# Patient Record
Sex: Female | Born: 1940 | Race: White | Hispanic: No | Marital: Married | Smoking: Never smoker
Health system: Southern US, Community
[De-identification: ages and names within clinical notes are randomized; demographics above are authoritative.]

## PROBLEM LIST (undated history)

## (undated) DIAGNOSIS — I700 Atherosclerosis of aorta: Secondary | ICD-10-CM

## (undated) DIAGNOSIS — Z8619 Personal history of other infectious and parasitic diseases: Secondary | ICD-10-CM

## (undated) DIAGNOSIS — E7800 Pure hypercholesterolemia, unspecified: Secondary | ICD-10-CM

## (undated) DIAGNOSIS — R232 Flushing: Secondary | ICD-10-CM

## (undated) DIAGNOSIS — M109 Gout, unspecified: Secondary | ICD-10-CM

## (undated) DIAGNOSIS — K760 Fatty (change of) liver, not elsewhere classified: Secondary | ICD-10-CM

## (undated) DIAGNOSIS — R251 Tremor, unspecified: Secondary | ICD-10-CM

## (undated) DIAGNOSIS — I10 Essential (primary) hypertension: Secondary | ICD-10-CM

## (undated) DIAGNOSIS — M549 Dorsalgia, unspecified: Secondary | ICD-10-CM

## (undated) DIAGNOSIS — M8580 Other specified disorders of bone density and structure, unspecified site: Secondary | ICD-10-CM

## (undated) DIAGNOSIS — K649 Unspecified hemorrhoids: Secondary | ICD-10-CM

## (undated) DIAGNOSIS — E559 Vitamin D deficiency, unspecified: Secondary | ICD-10-CM

## (undated) HISTORY — DX: Flushing: R23.2

## (undated) HISTORY — DX: Other specified disorders of bone density and structure, unspecified site: M85.80

## (undated) HISTORY — DX: Vitamin D deficiency, unspecified: E55.9

## (undated) HISTORY — DX: Gout, unspecified: M10.9

## (undated) HISTORY — DX: Fatty (change of) liver, not elsewhere classified: K76.0

## (undated) HISTORY — DX: Hypercalcemia: E83.52

## (undated) HISTORY — DX: Pure hypercholesterolemia, unspecified: E78.00

## (undated) HISTORY — DX: Tremor, unspecified: R25.1

## (undated) HISTORY — DX: Unspecified hemorrhoids: K64.9

## (undated) HISTORY — DX: Personal history of other infectious and parasitic diseases: Z86.19

## (undated) HISTORY — DX: Atherosclerosis of aorta: I70.0

## (undated) HISTORY — DX: Dorsalgia, unspecified: M54.9

---

## 2000-12-25 ENCOUNTER — Other Ambulatory Visit: Admission: RE | Admit: 2000-12-25 | Discharge: 2000-12-25 | Payer: Self-pay | Admitting: Family Medicine

## 2003-12-01 ENCOUNTER — Ambulatory Visit (HOSPITAL_COMMUNITY): Admission: RE | Admit: 2003-12-01 | Discharge: 2003-12-01 | Payer: Self-pay | Admitting: Family Medicine

## 2003-12-01 ENCOUNTER — Other Ambulatory Visit: Admission: RE | Admit: 2003-12-01 | Discharge: 2003-12-01 | Payer: Self-pay | Admitting: Family Medicine

## 2004-04-10 ENCOUNTER — Ambulatory Visit (HOSPITAL_COMMUNITY): Admission: RE | Admit: 2004-04-10 | Discharge: 2004-04-10 | Payer: Self-pay | Admitting: Gastroenterology

## 2005-02-21 ENCOUNTER — Other Ambulatory Visit: Admission: RE | Admit: 2005-02-21 | Discharge: 2005-02-21 | Payer: Self-pay | Admitting: Family Medicine

## 2008-02-05 ENCOUNTER — Other Ambulatory Visit: Admission: RE | Admit: 2008-02-05 | Discharge: 2008-02-05 | Payer: Self-pay | Admitting: Family Medicine

## 2010-07-19 ENCOUNTER — Encounter: Admission: RE | Admit: 2010-07-19 | Discharge: 2010-07-19 | Payer: Self-pay | Admitting: Family Medicine

## 2010-08-02 ENCOUNTER — Emergency Department (HOSPITAL_BASED_OUTPATIENT_CLINIC_OR_DEPARTMENT_OTHER): Admission: EM | Admit: 2010-08-02 | Discharge: 2010-08-02 | Payer: Self-pay | Admitting: Emergency Medicine

## 2010-08-02 ENCOUNTER — Ambulatory Visit: Payer: Self-pay | Admitting: Diagnostic Radiology

## 2011-02-01 NOTE — Op Note (Signed)
NAME:  Lauren Key, Lauren Key                              ACCOUNT NO.:  [id number]   MEDICAL RECORD NO.:  [id number]                   PATIENT TYPE:  AMB   LOCATION:  ENDO                                 FACILITY:  [facility]   PHYSICIAN:  Graylin Shiver, M.D.                DATE OF BIRTH:  28-May-1941   DATE OF PROCEDURE:  04/10/2004  DATE OF DISCHARGE:                                 OPERATIVE REPORT   PROCEDURE:  Colonoscopy.   ENDOSCOPIST:  Graylin Shiver, M.D.   INDICATIONS:  Intermittent rectal bleeding.   INFORMED CONSENT:  Informed consent was obtained after explanation of the  risks of bleeding, infection and perforation.   PREMEDICATION:  Fentanyl 65 mcg IV, Versed 5 mg IV.   DESCRIPTION OF PROCEDURE:  With the patient in the left lateral decubitus  position, a rectal exam was performed and no masses were felt.  The Olympus  colonoscope was inserted into the rectum and advanced around the colon to  the cecum.  The cecal landmarks were identified.  The cecum and ascending  colon were normal.  The transverse colon was normal.  The descending colon,  sigmoid and rectum were normal.  The scope was retroflexed in the rectum.  No significant abnormalities were seen.  The scope was then straightened and  brought out.  She tolerated the procedure well without complications.   IMPRESSION:  Normal colonoscopy to the cecum.   I suspect that the intermittent rectal bleeding the patient is secondary to  some bleeding from the anal area irritation at times.  I see no specific  abnormality on examination.                                               Graylin Shiver, M.D.    Germain Osgood  D:  04/10/2004  T:  04/10/2004  Job:  [id number]   cc:   Meredith Staggers, M.D.  [address], [address]  [place]  [place] [address]  Fax: [phone]

## 2011-11-27 DIAGNOSIS — J40 Bronchitis, not specified as acute or chronic: Secondary | ICD-10-CM | POA: Diagnosis not present

## 2011-12-05 DIAGNOSIS — M899 Disorder of bone, unspecified: Secondary | ICD-10-CM | POA: Diagnosis not present

## 2011-12-05 DIAGNOSIS — E559 Vitamin D deficiency, unspecified: Secondary | ICD-10-CM | POA: Diagnosis not present

## 2011-12-05 DIAGNOSIS — I10 Essential (primary) hypertension: Secondary | ICD-10-CM | POA: Diagnosis not present

## 2011-12-05 DIAGNOSIS — E782 Mixed hyperlipidemia: Secondary | ICD-10-CM | POA: Diagnosis not present

## 2011-12-05 DIAGNOSIS — M949 Disorder of cartilage, unspecified: Secondary | ICD-10-CM | POA: Diagnosis not present

## 2011-12-05 DIAGNOSIS — R7309 Other abnormal glucose: Secondary | ICD-10-CM | POA: Diagnosis not present

## 2011-12-05 DIAGNOSIS — R0989 Other specified symptoms and signs involving the circulatory and respiratory systems: Secondary | ICD-10-CM | POA: Diagnosis not present

## 2012-06-04 DIAGNOSIS — R0989 Other specified symptoms and signs involving the circulatory and respiratory systems: Secondary | ICD-10-CM | POA: Diagnosis not present

## 2012-06-04 DIAGNOSIS — E782 Mixed hyperlipidemia: Secondary | ICD-10-CM | POA: Diagnosis not present

## 2012-06-04 DIAGNOSIS — I10 Essential (primary) hypertension: Secondary | ICD-10-CM | POA: Diagnosis not present

## 2012-06-04 DIAGNOSIS — R7309 Other abnormal glucose: Secondary | ICD-10-CM | POA: Diagnosis not present

## 2012-06-04 DIAGNOSIS — H8110 Benign paroxysmal vertigo, unspecified ear: Secondary | ICD-10-CM | POA: Diagnosis not present

## 2012-06-04 DIAGNOSIS — M949 Disorder of cartilage, unspecified: Secondary | ICD-10-CM | POA: Diagnosis not present

## 2012-06-04 DIAGNOSIS — E559 Vitamin D deficiency, unspecified: Secondary | ICD-10-CM | POA: Diagnosis not present

## 2012-06-04 DIAGNOSIS — M899 Disorder of bone, unspecified: Secondary | ICD-10-CM | POA: Diagnosis not present

## 2012-11-28 DIAGNOSIS — M2550 Pain in unspecified joint: Secondary | ICD-10-CM | POA: Diagnosis not present

## 2012-11-28 DIAGNOSIS — M25579 Pain in unspecified ankle and joints of unspecified foot: Secondary | ICD-10-CM | POA: Diagnosis not present

## 2012-12-05 DIAGNOSIS — J209 Acute bronchitis, unspecified: Secondary | ICD-10-CM | POA: Diagnosis not present

## 2013-03-23 DIAGNOSIS — M949 Disorder of cartilage, unspecified: Secondary | ICD-10-CM | POA: Diagnosis not present

## 2013-03-23 DIAGNOSIS — R7309 Other abnormal glucose: Secondary | ICD-10-CM | POA: Diagnosis not present

## 2013-03-23 DIAGNOSIS — M899 Disorder of bone, unspecified: Secondary | ICD-10-CM | POA: Diagnosis not present

## 2013-03-23 DIAGNOSIS — Z8701 Personal history of pneumonia (recurrent): Secondary | ICD-10-CM | POA: Diagnosis not present

## 2013-03-23 DIAGNOSIS — Z1331 Encounter for screening for depression: Secondary | ICD-10-CM | POA: Diagnosis not present

## 2013-03-23 DIAGNOSIS — I10 Essential (primary) hypertension: Secondary | ICD-10-CM | POA: Diagnosis not present

## 2013-03-23 DIAGNOSIS — E782 Mixed hyperlipidemia: Secondary | ICD-10-CM | POA: Diagnosis not present

## 2013-03-23 DIAGNOSIS — E559 Vitamin D deficiency, unspecified: Secondary | ICD-10-CM | POA: Diagnosis not present

## 2013-03-23 DIAGNOSIS — R0989 Other specified symptoms and signs involving the circulatory and respiratory systems: Secondary | ICD-10-CM | POA: Diagnosis not present

## 2013-03-23 DIAGNOSIS — M79609 Pain in unspecified limb: Secondary | ICD-10-CM | POA: Diagnosis not present

## 2013-07-21 DIAGNOSIS — R059 Cough, unspecified: Secondary | ICD-10-CM | POA: Diagnosis not present

## 2013-07-21 DIAGNOSIS — R05 Cough: Secondary | ICD-10-CM | POA: Diagnosis not present

## 2013-07-21 DIAGNOSIS — R509 Fever, unspecified: Secondary | ICD-10-CM | POA: Diagnosis not present

## 2013-07-28 DIAGNOSIS — H2510 Age-related nuclear cataract, unspecified eye: Secondary | ICD-10-CM | POA: Diagnosis not present

## 2013-09-21 DIAGNOSIS — Z23 Encounter for immunization: Secondary | ICD-10-CM | POA: Diagnosis not present

## 2013-09-21 DIAGNOSIS — M949 Disorder of cartilage, unspecified: Secondary | ICD-10-CM | POA: Diagnosis not present

## 2013-09-21 DIAGNOSIS — R7309 Other abnormal glucose: Secondary | ICD-10-CM | POA: Diagnosis not present

## 2013-09-21 DIAGNOSIS — I10 Essential (primary) hypertension: Secondary | ICD-10-CM | POA: Diagnosis not present

## 2013-09-21 DIAGNOSIS — Z8701 Personal history of pneumonia (recurrent): Secondary | ICD-10-CM | POA: Diagnosis not present

## 2013-09-21 DIAGNOSIS — M109 Gout, unspecified: Secondary | ICD-10-CM | POA: Diagnosis not present

## 2013-09-21 DIAGNOSIS — R0989 Other specified symptoms and signs involving the circulatory and respiratory systems: Secondary | ICD-10-CM | POA: Diagnosis not present

## 2013-09-21 DIAGNOSIS — E782 Mixed hyperlipidemia: Secondary | ICD-10-CM | POA: Diagnosis not present

## 2013-09-21 DIAGNOSIS — E559 Vitamin D deficiency, unspecified: Secondary | ICD-10-CM | POA: Diagnosis not present

## 2013-09-21 DIAGNOSIS — M899 Disorder of bone, unspecified: Secondary | ICD-10-CM | POA: Diagnosis not present

## 2013-10-07 DIAGNOSIS — M949 Disorder of cartilage, unspecified: Secondary | ICD-10-CM | POA: Diagnosis not present

## 2013-10-07 DIAGNOSIS — M899 Disorder of bone, unspecified: Secondary | ICD-10-CM | POA: Diagnosis not present

## 2013-10-11 ENCOUNTER — Encounter (HOSPITAL_COMMUNITY): Payer: Self-pay | Admitting: Emergency Medicine

## 2013-10-11 ENCOUNTER — Emergency Department (HOSPITAL_COMMUNITY): Payer: Medicare Other

## 2013-10-11 ENCOUNTER — Inpatient Hospital Stay (HOSPITAL_COMMUNITY)
Admission: EM | Admit: 2013-10-11 | Discharge: 2013-10-13 | DRG: 871 | Disposition: A | Discharge status: Home / Self Care | Payer: Medicare Other | Attending: Internal Medicine | Admitting: Internal Medicine

## 2013-10-11 DIAGNOSIS — R0602 Shortness of breath: Secondary | ICD-10-CM | POA: Diagnosis not present

## 2013-10-11 DIAGNOSIS — E876 Hypokalemia: Secondary | ICD-10-CM | POA: Diagnosis not present

## 2013-10-11 DIAGNOSIS — I10 Essential (primary) hypertension: Secondary | ICD-10-CM | POA: Diagnosis present

## 2013-10-11 DIAGNOSIS — E785 Hyperlipidemia, unspecified: Secondary | ICD-10-CM | POA: Diagnosis present

## 2013-10-11 DIAGNOSIS — A419 Sepsis, unspecified organism: Principal | ICD-10-CM | POA: Diagnosis present

## 2013-10-11 DIAGNOSIS — R0902 Hypoxemia: Secondary | ICD-10-CM

## 2013-10-11 DIAGNOSIS — J9600 Acute respiratory failure, unspecified whether with hypoxia or hypercapnia: Secondary | ICD-10-CM | POA: Diagnosis present

## 2013-10-11 DIAGNOSIS — R6889 Other general symptoms and signs: Secondary | ICD-10-CM | POA: Diagnosis not present

## 2013-10-11 DIAGNOSIS — J189 Pneumonia, unspecified organism: Secondary | ICD-10-CM

## 2013-10-11 DIAGNOSIS — R509 Fever, unspecified: Secondary | ICD-10-CM | POA: Diagnosis not present

## 2013-10-11 HISTORY — DX: Essential (primary) hypertension: I10

## 2013-10-11 HISTORY — DX: Hypokalemia: E87.6

## 2013-10-11 HISTORY — DX: Pneumonia, unspecified organism: J18.9

## 2013-10-11 LAB — CBC WITH DIFFERENTIAL/PLATELET
Basophils Absolute: 0.1 10*3/uL (ref 0.0–0.1)
Basophils Relative: 1 % (ref 0–1)
Eosinophils Absolute: 0 10*3/uL (ref 0.0–0.7)
Eosinophils Relative: 0 % (ref 0–5)
HCT: 42 % (ref 36.0–46.0)
Hemoglobin: 14.9 g/dL (ref 12.0–15.0)
Lymphocytes Relative: 19 % (ref 12–46)
Lymphs Abs: 1.6 10*3/uL (ref 0.7–4.0)
MCH: 32.6 pg (ref 26.0–34.0)
MCHC: 35.5 g/dL (ref 30.0–36.0)
MCV: 91.9 fL (ref 78.0–100.0)
Monocytes Absolute: 1.2 10*3/uL — ABNORMAL HIGH (ref 0.1–1.0)
Monocytes Relative: 14 % — ABNORMAL HIGH (ref 3–12)
Neutro Abs: 5.6 10*3/uL (ref 1.7–7.7)
Neutrophils Relative %: 66 % (ref 43–77)
Platelets: 248 10*3/uL (ref 150–400)
RBC: 4.57 MIL/uL (ref 3.87–5.11)
RDW: 12.6 % (ref 11.5–15.5)
WBC: 8.4 10*3/uL (ref 4.0–10.5)

## 2013-10-11 LAB — COMPREHENSIVE METABOLIC PANEL
ALT: 24 U/L (ref 0–35)
AST: 30 U/L (ref 0–37)
Albumin: 3.9 g/dL (ref 3.5–5.2)
Alkaline Phosphatase: 83 U/L (ref 39–117)
BUN: 15 mg/dL (ref 6–23)
CO2: 29 mEq/L (ref 19–32)
Calcium: 8.9 mg/dL (ref 8.4–10.5)
Chloride: 90 mEq/L — ABNORMAL LOW (ref 96–112)
Creatinine, Ser: 0.93 mg/dL (ref 0.50–1.10)
GFR calc Af Amer: 69 mL/min — ABNORMAL LOW (ref >90)
GFR calc non Af Amer: 60 mL/min — ABNORMAL LOW (ref >90)
Glucose, Bld: 104 mg/dL — ABNORMAL HIGH (ref 70–99)
Potassium: 2.8 mEq/L — CL (ref 3.7–5.3)
Sodium: 136 mEq/L — ABNORMAL LOW (ref 137–147)
Total Bilirubin: 0.7 mg/dL (ref 0.3–1.2)
Total Protein: 7.9 g/dL (ref 6.0–8.3)

## 2013-10-11 LAB — LACTIC ACID, PLASMA: Lactic Acid, Venous: 1.6 mmol/L (ref 0.5–2.2)

## 2013-10-11 LAB — MAGNESIUM: Magnesium: 1.7 mg/dL (ref 1.5–2.5)

## 2013-10-11 MED ORDER — POTASSIUM CHLORIDE CRYS ER 20 MEQ PO TBCR
40.0000 meq | EXTENDED_RELEASE_TABLET | Freq: Once | ORAL | Status: AC
  Administered 2013-10-12: 40 meq via ORAL
  Filled 2013-10-11: qty 2

## 2013-10-11 MED ORDER — AZITHROMYCIN 500 MG PO TABS
500.0000 mg | ORAL_TABLET | ORAL | Status: DC
  Administered 2013-10-12: 500 mg via ORAL
  Filled 2013-10-11 (×2): qty 1

## 2013-10-11 MED ORDER — SODIUM CHLORIDE 0.9 % IV SOLN
INTRAVENOUS | Status: DC
  Administered 2013-10-12 (×2): via INTRAVENOUS
  Administered 2013-10-12: 75 mL/h via INTRAVENOUS

## 2013-10-11 MED ORDER — DEXTROSE 5 % IV SOLN
1.0000 g | Freq: Once | INTRAVENOUS | Status: AC
  Administered 2013-10-11: 1 g via INTRAVENOUS
  Filled 2013-10-11: qty 10

## 2013-10-11 MED ORDER — OSELTAMIVIR PHOSPHATE 75 MG PO CAPS
75.0000 mg | ORAL_CAPSULE | Freq: Two times a day (BID) | ORAL | Status: DC
  Administered 2013-10-12: 75 mg via ORAL
  Filled 2013-10-11 (×3): qty 1

## 2013-10-11 MED ORDER — ALBUTEROL SULFATE (2.5 MG/3ML) 0.083% IN NEBU
5.0000 mg | INHALATION_SOLUTION | Freq: Once | RESPIRATORY_TRACT | Status: AC
  Administered 2013-10-11: 5 mg via RESPIRATORY_TRACT
  Filled 2013-10-11: qty 6

## 2013-10-11 MED ORDER — AZITHROMYCIN 250 MG PO TABS
500.0000 mg | ORAL_TABLET | Freq: Once | ORAL | Status: AC
  Administered 2013-10-11: 500 mg via ORAL
  Filled 2013-10-11: qty 2

## 2013-10-11 MED ORDER — ACETAMINOPHEN 325 MG PO TABS
650.0000 mg | ORAL_TABLET | Freq: Four times a day (QID) | ORAL | Status: DC | PRN
  Administered 2013-10-11: 650 mg via ORAL
  Filled 2013-10-11: qty 2

## 2013-10-11 MED ORDER — ALENDRONATE SODIUM 70 MG PO TABS: 70.0000 mg | ORAL_TABLET | ORAL | Status: DC

## 2013-10-11 MED ORDER — ACETAMINOPHEN 325 MG PO TABS: 650.0000 mg | ORAL_TABLET | Freq: Four times a day (QID) | ORAL | Status: DC | PRN

## 2013-10-11 MED ORDER — ALBUTEROL SULFATE (2.5 MG/3ML) 0.083% IN NEBU
2.5000 mg | INHALATION_SOLUTION | RESPIRATORY_TRACT | Status: DC
  Administered 2013-10-12 (×3): 2.5 mg via RESPIRATORY_TRACT
  Filled 2013-10-11 (×3): qty 3

## 2013-10-11 MED ORDER — VITAMIN D 50 MCG (2000 UT) PO CAPS: 1.0000 | ORAL_CAPSULE | Freq: Every day | ORAL | Status: DC

## 2013-10-11 MED ORDER — VITAMIN D3 25 MCG (1000 UNIT) PO TABS
2000.0000 [IU] | ORAL_TABLET | Freq: Every day | ORAL | Status: DC
  Administered 2013-10-12 – 2013-10-13 (×2): 2000 [IU] via ORAL
  Filled 2013-10-11 (×2): qty 2

## 2013-10-11 MED ORDER — POTASSIUM CHLORIDE CRYS ER 20 MEQ PO TBCR
40.0000 meq | EXTENDED_RELEASE_TABLET | Freq: Once | ORAL | Status: AC
  Administered 2013-10-11: 40 meq via ORAL
  Filled 2013-10-11: qty 2

## 2013-10-11 MED ORDER — ALBUTEROL SULFATE (2.5 MG/3ML) 0.083% IN NEBU: 2.5000 mg | INHALATION_SOLUTION | Freq: Four times a day (QID) | RESPIRATORY_TRACT | Status: DC

## 2013-10-11 MED ORDER — AMLODIPINE BESYLATE 5 MG PO TABS
5.0000 mg | ORAL_TABLET | Freq: Every day | ORAL | Status: DC
Start: 2013-10-12 — End: 2013-10-13
  Administered 2013-10-12 – 2013-10-13 (×2): 5 mg via ORAL
  Filled 2013-10-11 (×2): qty 1

## 2013-10-11 MED ORDER — ATORVASTATIN CALCIUM 20 MG PO TABS
20.0000 mg | ORAL_TABLET | Freq: Every day | ORAL | Status: DC
  Administered 2013-10-12 – 2013-10-13 (×2): 20 mg via ORAL
  Filled 2013-10-11 (×2): qty 1

## 2013-10-11 MED ORDER — IBUPROFEN 200 MG PO TABS
600.0000 mg | ORAL_TABLET | Freq: Once | ORAL | Status: AC
  Administered 2013-10-11: 600 mg via ORAL
  Filled 2013-10-11: qty 3

## 2013-10-11 MED ORDER — ATENOLOL 100 MG PO TABS
100.0000 mg | ORAL_TABLET | Freq: Every day | ORAL | Status: DC
  Administered 2013-10-12 – 2013-10-13 (×2): 100 mg via ORAL
  Filled 2013-10-11 (×2): qty 1

## 2013-10-11 MED ORDER — GUAIFENESIN-DM 100-10 MG/5ML PO SYRP
5.0000 mL | ORAL_SOLUTION | ORAL | Status: DC | PRN
  Administered 2013-10-12 – 2013-10-13 (×2): 5 mL via ORAL
  Filled 2013-10-11 (×2): qty 5

## 2013-10-11 MED ORDER — ENOXAPARIN SODIUM 40 MG/0.4ML ~~LOC~~ SOLN
40.0000 mg | Freq: Every day | SUBCUTANEOUS | Status: DC
  Administered 2013-10-12 (×2): 40 mg via SUBCUTANEOUS
  Filled 2013-10-11 (×3): qty 0.4

## 2013-10-11 MED ORDER — CEFTRIAXONE SODIUM 1 G IJ SOLR
1.0000 g | INTRAMUSCULAR | Status: DC
  Administered 2013-10-12: 1 g via INTRAVENOUS
  Filled 2013-10-11 (×2): qty 10

## 2013-10-11 MED ORDER — POTASSIUM CHLORIDE IN NACL 20-0.9 MEQ/L-% IV SOLN
Freq: Once | INTRAVENOUS | Status: AC
  Administered 2013-10-11: 20:00:00 via INTRAVENOUS
  Filled 2013-10-11: qty 1000

## 2013-10-11 NOTE — ED Provider Notes (Signed)
CSN: [id number]     Arrival date & time 10/11/13  1745 History   First MD Initiated Contact with Patient 10/11/13 1804     Chief Complaint  Patient presents with  . Shortness of Breath  . Fever   (Consider location/radiation/quality/duration/timing/severity/associated sxs/prior Treatment) HPI Comments: Pt is a non smoker.  Seen by PCP Dr. Moreen Fowler and sent to the ED for further evaluation, presumably for admission.  Spouse reports pt's RA sat at home was 89%.    Patient is a 73 y.o. female presenting with shortness of breath and fever.  Shortness of Breath Severity:  Moderate Onset quality:  Gradual Duration:  3 days Timing:  Constant Progression:  Worsening Chronicity:  New Associated symptoms: cough and fever   Associated symptoms: no chest pain and no vomiting   Cough:    Cough characteristics:  Productive   Duration:  3 days   Timing:  Constant   Progression:  Worsening   Chronicity:  New Fever Associated symptoms: cough and myalgias   Associated symptoms: no chest pain and no vomiting     Past Medical History  Diagnosis Date  . Hypertension    History reviewed. No pertinent past surgical history. History reviewed. No pertinent family history. History  Substance Use Topics  . Smoking status: Never Smoker   . Smokeless tobacco: Not on file  . Alcohol Use: No   OB History   Grav Para Term Preterm Abortions TAB SAB Ect Mult Living                 Review of Systems  Constitutional: Positive for fever and appetite change.  Respiratory: Positive for cough and shortness of breath.   Cardiovascular: Negative for chest pain.  Gastrointestinal: Negative for vomiting.  Musculoskeletal: Positive for myalgias.  Neurological: Positive for weakness.  All other systems reviewed and are negative.    Allergies  Tetanus toxoids  Home Medications   Current Outpatient Rx  Name  Route  Sig  Dispense  Refill  . alendronate (FOSAMAX) 70 MG tablet   Oral   Take 70 mg by  mouth once a week. Take with a full glass of water on an empty stomach. Take on fridays or saturdays         . amLODipine (NORVASC) 5 MG tablet   Oral   Take 5 mg by mouth daily.         Marland Kitchen atenolol-chlorthalidone (TENORETIC) 100-25 MG per tablet   Oral   Take 1 tablet by mouth daily.         Marland Kitchen atorvastatin (LIPITOR) 20 MG tablet   Oral   Take 20 mg by mouth daily.         . Cholecalciferol (VITAMIN D) 2000 UNITS CAPS   Oral   Take by mouth.          BP 145/65  Pulse 102  Temp(Src) 99 F (37.2 C) (Oral)  Resp 22  SpO2 93% Physical Exam  Nursing note and vitals reviewed. Constitutional: She is oriented to person, place, and time. She appears well-developed and well-nourished. No distress.  HENT:  Head: Normocephalic and atraumatic.  Eyes: Conjunctivae and EOM are normal.  Neck: Normal range of motion. Neck supple.  Cardiovascular: Normal rate, regular rhythm and intact distal pulses.   Pulmonary/Chest: Tachypnea noted. No respiratory distress. She has wheezes in the right lower field and the left lower field. She has rhonchi. She has rales in the right lower field and the left lower field.  Abdominal: Soft.  Musculoskeletal: She exhibits no edema.  Neurological: She is alert and oriented to person, place, and time. Coordination normal.  Skin: Skin is warm.  Psychiatric: She has a normal mood and affect.    ED Course  Procedures (including critical care time) Labs Review Labs Reviewed  CBC WITH DIFFERENTIAL - Abnormal; Notable for the following:    Monocytes Relative 14 (*)    Monocytes Absolute 1.2 (*)    All other components within normal limits  COMPREHENSIVE METABOLIC PANEL - Abnormal; Notable for the following:    Sodium 136 (*)    Potassium 2.8 (*)    Chloride 90 (*)    Glucose, Bld 104 (*)    GFR calc non Af Amer 60 (*)    GFR calc Af Amer 69 (*)    All other components within normal limits  CULTURE, BLOOD (ROUTINE X 2)  CULTURE, BLOOD (ROUTINE  X 2)  LACTIC ACID, PLASMA   Imaging Review Dg Chest Port 1 View  10/11/2013   CLINICAL DATA:  Fever and shortness of breath.  EXAM: PORTABLE CHEST - 1 VIEW  COMPARISON:  12/05/2012  FINDINGS: Opacity at the left lung base may represent atelectasis versus subtle infiltrate. No edema is identified. The heart size is normal.  IMPRESSION: Left basilar atelectasis versus subtle infiltrate.   Electronically Signed   By: [name] M.D.   On: 10/11/2013 19:08    EKG Interpretation    Date/Time:  Monday October 11 2013 20:22:30 EST Ventricular Rate:  105 PR Interval:  178 QRS Duration: 85 QT Interval:  362 QTC Calculation: 478 R Axis:   -54 Text Interpretation:  Sinus tachycardia Probable left atrial enlargement Left ventricular hypertrophy with repolarization abnormality Poor R wave progression Abnormal ekg No previous tracing Confirmed by [facility]  MD, MICHEAL ([id number]) on 10/11/2013 8:34:45 PM           RA sat is 90% and I interpret to be abnormal  Improved to 97% on 2L [place] O2  8:06 PM K+ is low at 2.8k ECG, start oral and IV replacement.   8:35 PM ECG shows no sig changes related to hypokalemia.  Will contact [facility] for admission.  MDM   1. Community acquired pneumonia   2. Hypoxia   3. Hypokalemia      Pt is febrile, not hypotensive, normal mentation, sent by PCP presumably for clinical dx of pneumonia.  No recent hospitalization.  O2 sats low, improved with oxygen supplemnt here.  Not toxic appearing.  Will give O2, nebs, IV abx and admit.      Saddie Benders. Dorna Mai, MD 10/11/13 2035

## 2013-10-11 NOTE — ED Notes (Signed)
Pt here with sob and low oxygen sats at md office and was told has touch of pneumonia.

## 2013-10-11 NOTE — Progress Notes (Signed)

## 2013-10-11 NOTE — H&P (Signed)
[facility] History and Physical  Lauren Key KWI:[id number] DOB: [birth year] DOA: 10/11/2013  Referring physician: ED PCP: [name], MD   Chief Complaint:  Cough with fever and shortness of breath for past 3 days  HPI:  73 year old female with history of hypertension was sent by her PCP for fever with shortness of breath and productive cough for past  3 days. Patient was in her usual state of health until 2 days back when she started having productive cough with whitish phlegm associated with generalized malaise , dyspnea on exertion and fever of 10 28F. She denies any headache, blurred vision, dizziness, chest pain, palpitations, nausea, vomiting, abdominal pain, bowel or urinary symptoms, joint pains. In the PCP office he was found to have oxygen saturation of 89% on room air. She denies any sick contacts. Reports taking flu vaccine this flu season.  Course in the ED Patient was febrile to 102.68 Fahrenheit, mildly tachycardic to 102 and tachypnea to 26. O2 sat was in mid 90s on 2 L via nasal cannula. Blood work showed WBC of 8.4, hemoglobin of 14.9 and hematocrit of 42, platelets 248, sodium 136, potassium of 2.8 and chloride of 90. Chest x-ray showed left lower lobe atelectasis versus infiltrate. EKG was unremarkable except for mild sinus tachycardia. Patient given 40 mg of oral potassium, 1 dose of IV Rocephin and azithromycin and prior hospitalists consulted for admission to telemetry for community-acquired pneumonia possible influenza.  Review of Systems:  Constitutional: fever, chills,  appetite change and fatigue.  denies diaphoresis, HEENT:congestion, sore throat, rhinorrhea, sneezing, Denies photophobia, eye pain, redness, hearing loss, ear pain,  mouth sores, trouble swallowing, neck pain, neck stiffness and tinnitus.   Respiratory: SOB, DOE, cough, and wheezing  Cardiovascular: Denies chest pain, palpitations and leg swelling.  Gastrointestinal: Denies nausea,  vomiting, abdominal pain, diarrhea, constipation, blood in stool and abdominal distention.  Genitourinary: Denies dysuria, urgency, frequency, hematuria, flank pain and difficulty urinating.  Endocrine: Denies polyuria, polydipsia. Musculoskeletal: myalgia,  back pain, joint swelling, arthralgias and gait problem.  Skin: Denies pallor, rash and wound.  Neurological: Denies dizziness, seizures, syncope, weakness, light-headedness, numbness and headaches.  Psychiatric/Behavioral: Denies confusion, nervousness, sleep disturbance and agitation   Past Medical History  Diagnosis Date  . Hypertension    History reviewed. No pertinent past surgical history. Social History:  reports that she has never smoked. She does not have any smokeless tobacco history on file. She reports that she does not drink alcohol or use illicit drugs.  Allergies  Allergen Reactions  . Tetanus Toxoids     Could not breath     History reviewed. No pertinent family history.  Prior to Admission medications   Medication Sig Start Date End Date Taking? Authorizing Provider  alendronate (FOSAMAX) 70 MG tablet Take 70 mg by mouth once a week. Take with a full glass of water on an empty stomach. Take on fridays or saturdays   Yes Historical Provider, MD  amLODipine (NORVASC) 5 MG tablet Take 5 mg by mouth daily.   Yes Historical Provider, MD  atenolol-chlorthalidone (TENORETIC) 100-25 MG per tablet Take 1 tablet by mouth daily.   Yes Historical Provider, MD  atorvastatin (LIPITOR) 20 MG tablet Take 20 mg by mouth daily.   Yes Historical Provider, MD  Cholecalciferol (VITAMIN D) 2000 UNITS CAPS Take by mouth.   Yes Historical Provider, MD    Physical Exam:  Filed Vitals:   10/11/13 1828 10/11/13 2001 10/11/13 2030 10/11/13 2130  BP:  145/65 137/50  129/58  Pulse:  102 102 89  Temp: 102.4 F (39.1 C) 99 F (37.2 C)    TempSrc: Rectal Oral    Resp:  22 26 19   SpO2:  93% 93% 94%    Constitutional: Vital signs  reviewed.  Patient is an elderly female lying in bed in no acute distress. Has congestive cough HEENT: No pallor, no icterus, moist oral mucosa Chest: Bilateral scattered  wheezing and rhonchi, no crackles CVS: Normal S1 and S2, no murmurs rub or gallop Abdomen: Soft, nontender, nondistended, bowel sounds present Extremities: Warm, no edema CNS: AAO x3 Labs on Admission:  Basic Metabolic Panel:  Recent Labs Lab 10/11/13 1830  NA 136*  K 2.8*  CL 90*  CO2 29  GLUCOSE 104*  BUN 15  CREATININE 0.93  CALCIUM 8.9   Liver Function Tests:  Recent Labs Lab 10/11/13 1830  AST 30  ALT 24  ALKPHOS 83  BILITOT 0.7  PROT 7.9  ALBUMIN 3.9   No results found for this basename: LIPASE, AMYLASE,  in the last 168 hours No results found for this basename: AMMONIA,  in the last 168 hours CBC:  Recent Labs Lab 10/11/13 1830  WBC 8.4  NEUTROABS 5.6  HGB 14.9  HCT 42.0  MCV 91.9  PLT 248   Cardiac Enzymes: No results found for this basename: CKTOTAL, CKMB, CKMBINDEX, TROPONINI,  in the last 168 hours BNP: No components found with this basename: POCBNP,  CBG: No results found for this basename: GLUCAP,  in the last 168 hours  Radiological Exams on Admission: Dg Chest Port 1 View  10/11/2013   CLINICAL DATA:  Fever and shortness of breath.  EXAM: PORTABLE CHEST - 1 VIEW  COMPARISON:  12/05/2012  FINDINGS: Opacity at the left lung base may represent atelectasis versus subtle infiltrate. No edema is identified. The heart size is normal.  IMPRESSION: Left basilar atelectasis versus subtle infiltrate.   Electronically Signed   By: [name] M.D.   On: 10/11/2013 19:08    EKG: Sinus tachycardia at 105, no ST-T changes  Assessment/Plan  Principal Problem:   SIRS due to  Community acquired pneumonia Patient meets criteria for SIRS with the Roth 102.59F, tachycardia and tachypnea. Monitor on telemetry. Chest x-ray suggestive of possible left basilar infiltrate. Patient received  IV Rocephin and azithromycin in the ED which I would continue. Follow blood culture, sputum culture, urine for strep antigen and Legionella antigen. Continue O2 via nasal cannula. We'll place her on scheduled nebs given active wheezing. -Check influenza PCR which is highly likely to be positive. I will place her on empiric Tamiflu. -Supportive care with IV fluids, Tylenol and antitussives.  Active Problems:   Hypertension Resume home dose amlodipine and atenolol. Would hold chlorthalidone given hypokalemia    Hypokalemia Replenish with KCL. Check magnesium levels. Monitor on telemetry    Diet: Regular DVT prophylaxis: Subcutaneous Lovenox  Code Status: Full code Family Communication: Husband at bedside Disposition Plan: Home once improved  Louellen Molder [facility] Pager [phone]  If 7PM-7AM, please contact night-coverage [email or web address] Password [email or web address] 10/11/2013, 10:14 PM   Total time spent on admission: 70 minutes

## 2013-10-12 DIAGNOSIS — J189 Pneumonia, unspecified organism: Secondary | ICD-10-CM | POA: Diagnosis not present

## 2013-10-12 DIAGNOSIS — I10 Essential (primary) hypertension: Secondary | ICD-10-CM | POA: Diagnosis not present

## 2013-10-12 DIAGNOSIS — A419 Sepsis, unspecified organism: Secondary | ICD-10-CM | POA: Diagnosis not present

## 2013-10-12 DIAGNOSIS — E876 Hypokalemia: Secondary | ICD-10-CM | POA: Diagnosis not present

## 2013-10-12 HISTORY — DX: Sepsis, unspecified organism: A41.9

## 2013-10-12 LAB — BASIC METABOLIC PANEL
BUN: 17 mg/dL (ref 6–23)
BUN: 10 mg/dL (ref 6–23)
CO2: 28 mEq/L (ref 19–32)
CO2: 27 mEq/L (ref 19–32)
Calcium: 7.8 mg/dL — ABNORMAL LOW (ref 8.4–10.5)
Calcium: 7.9 mg/dL — ABNORMAL LOW (ref 8.4–10.5)
Chloride: 97 mEq/L (ref 96–112)
Chloride: 98 mEq/L (ref 96–112)
Creatinine, Ser: 0.88 mg/dL (ref 0.50–1.10)
Creatinine, Ser: 0.83 mg/dL (ref 0.50–1.10)
GFR calc Af Amer: 74 mL/min — ABNORMAL LOW (ref >90)
GFR calc Af Amer: 80 mL/min — ABNORMAL LOW (ref >90)
GFR calc non Af Amer: 64 mL/min — ABNORMAL LOW (ref >90)
GFR calc non Af Amer: 69 mL/min — ABNORMAL LOW (ref >90)
Glucose, Bld: 102 mg/dL — ABNORMAL HIGH (ref 70–99)
Glucose, Bld: 106 mg/dL — ABNORMAL HIGH (ref 70–99)
Potassium: 3.3 mEq/L — ABNORMAL LOW (ref 3.7–5.3)
Potassium: 4 mEq/L (ref 3.7–5.3)
Sodium: 139 mEq/L (ref 137–147)
Sodium: 137 mEq/L (ref 137–147)

## 2013-10-12 LAB — INFLUENZA PANEL BY PCR (TYPE A & B)
H1N1 flu by pcr: NOT DETECTED
Influenza A By PCR: NEGATIVE
Influenza B By PCR: NEGATIVE

## 2013-10-12 LAB — MAGNESIUM: Magnesium: 1.8 mg/dL (ref 1.5–2.5)

## 2013-10-12 LAB — HIV ANTIBODY (ROUTINE TESTING W REFLEX): HIV: NONREACTIVE

## 2013-10-12 LAB — STREP PNEUMONIAE URINARY ANTIGEN: Strep Pneumo Urinary Antigen: NEGATIVE

## 2013-10-12 MED ORDER — MAGNESIUM SULFATE 40 MG/ML IJ SOLN
2.0000 g | Freq: Once | INTRAMUSCULAR | Status: AC
  Administered 2013-10-12: 2 g via INTRAVENOUS
  Filled 2013-10-12: qty 50

## 2013-10-12 MED ORDER — GUAIFENESIN-DM 100-10 MG/5ML PO SYRP: 5.0000 mL | ORAL_SOLUTION | ORAL | Status: DC | PRN

## 2013-10-12 MED ORDER — ONDANSETRON HCL 4 MG/2ML IJ SOLN: 4.0000 mg | Freq: Four times a day (QID) | INTRAMUSCULAR | Status: DC | PRN

## 2013-10-12 MED ORDER — ALBUTEROL SULFATE (2.5 MG/3ML) 0.083% IN NEBU
2.5000 mg | INHALATION_SOLUTION | RESPIRATORY_TRACT | Status: DC | PRN
  Filled 2013-10-12: qty 3

## 2013-10-12 MED ORDER — ALBUTEROL SULFATE (2.5 MG/3ML) 0.083% IN NEBU
2.5000 mg | INHALATION_SOLUTION | Freq: Three times a day (TID) | RESPIRATORY_TRACT | Status: DC
  Administered 2013-10-12 – 2013-10-13 (×3): 2.5 mg via RESPIRATORY_TRACT
  Filled 2013-10-12 (×4): qty 3

## 2013-10-12 MED ORDER — POTASSIUM CHLORIDE CRYS ER 20 MEQ PO TBCR
40.0000 meq | EXTENDED_RELEASE_TABLET | Freq: Once | ORAL | Status: AC
  Administered 2013-10-12: 40 meq via ORAL
  Filled 2013-10-12: qty 2

## 2013-10-12 NOTE — Progress Notes (Signed)
[facility] PROGRESS NOTE Interim History: 73 year old female with history of hypertension was sent by her PCP for fever with shortness of breath and productive cough for past 3 days. Patient was in her usual state of health until 2 days back when she started having productive cough with whitish phlegm associated with generalized malaise , dyspnea on exertion and fever of 10 72F. She denies any headache, blurred vision, dizziness, chest pain, palpitations, nausea, vomiting, abdominal pain, bowel or urinary symptoms, joint pains. In the PCP office he was found to have oxygen saturation of 89% on room air.   Assessment/Plan: Sepsis due Community acquired pneumonia - IV rocephin and azithromycin, defervecing. - blood cultures x 2 negative till date. - robitussin for cough  Hypertension - stable monitor.  Hypokalemia - replete K & Mag.    Code Status: full Family Communication: none  Disposition Plan: inpatinet   Consultants:  none  Procedures:  CXR  Antibiotics:  Rocephin and azithro 1.26.2014  HPI/Subjective: Coughing. No pain  Objective: Filed Vitals:   10/12/13 0046 10/12/13 0347 10/12/13 0542 10/12/13 0840  BP:   150/57   Pulse:  68 78   Temp:   98.6 F (37 C)   TempSrc:   Oral   Resp:  16 20   Height:      Weight:   64.9 kg (143 lb 1.3 oz)   SpO2: 95% 95% 97% 97%    Intake/Output Summary (Last 24 hours) at 10/12/13 1007 Last data filed at 10/12/13 0935  Gross per 24 hour  Intake 1514.17 ml  Output    500 ml  Net 1014.17 ml   Filed Weights   10/11/13 2256 10/12/13 0542  Weight: 64.5 kg (142 lb 3.2 oz) 64.9 kg (143 lb 1.3 oz)    Exam:  General: Alert, awake, oriented x3, in no acute distress.  HEENT: No bruits, no goiter.  Heart: Regular rate and rhythm, without murmurs, rubs, gallops.  Lungs: Good air movement,clear to auscultation Abdomen: Soft, nontender, nondistended, positive bowel sounds.     Data Reviewed: Basic Metabolic  Panel:  Recent Labs Lab 10/11/13 1830 10/12/13 0507  NA 136* 139  K 2.8* 3.3*  CL 90* 97  CO2 29 28  GLUCOSE 104* 102*  BUN 15 17  CREATININE 0.93 0.88  CALCIUM 8.9 7.8*  MG 1.7  --    Liver Function Tests:  Recent Labs Lab 10/11/13 1830  AST 30  ALT 24  ALKPHOS 83  BILITOT 0.7  PROT 7.9  ALBUMIN 3.9   No results found for this basename: LIPASE, AMYLASE,  in the last 168 hours No results found for this basename: AMMONIA,  in the last 168 hours CBC:  Recent Labs Lab 10/11/13 1830  WBC 8.4  NEUTROABS 5.6  HGB 14.9  HCT 42.0  MCV 91.9  PLT 248   Cardiac Enzymes: No results found for this basename: CKTOTAL, CKMB, CKMBINDEX, TROPONINI,  in the last 168 hours BNP (last 3 results) No results found for this basename: PROBNP,  in the last 8760 hours CBG: No results found for this basename: GLUCAP,  in the last 168 hours  No results found for this or any previous visit (from the past 240 hour(s)).   Studies: Dg Chest Port 1 View  10/11/2013   CLINICAL DATA:  Fever and shortness of breath.  EXAM: PORTABLE CHEST - 1 VIEW  COMPARISON:  12/05/2012  FINDINGS: Opacity at the left lung base may represent atelectasis versus subtle infiltrate. No edema is  identified. The heart size is normal.  IMPRESSION: Left basilar atelectasis versus subtle infiltrate.   Electronically Signed   By: [name] M.D.   On: 10/11/2013 19:08    Scheduled Meds: . albuterol  2.5 mg Nebulization TID  . amLODipine  5 mg Oral Daily  . atenolol  100 mg Oral Daily  . atorvastatin  20 mg Oral Daily  . azithromycin  500 mg Oral Q24H  . cefTRIAXone (ROCEPHIN)  IV  1 g Intravenous Q24H  . cholecalciferol  2,000 Units Oral Daily  . enoxaparin (LOVENOX) injection  40 mg Subcutaneous QHS  . potassium chloride  40 mEq Oral Once   Continuous Infusions: . sodium chloride 75 mL/hr at 10/12/13 [place], ABRAHAM  [facility] Pager [phone]. If 8PM-8AM, please contact  night-coverage at [email or web address], password [email or web address] 10/12/2013, 10:07 AM  LOS: 1 day

## 2013-10-13 DIAGNOSIS — I10 Essential (primary) hypertension: Secondary | ICD-10-CM | POA: Diagnosis not present

## 2013-10-13 DIAGNOSIS — J189 Pneumonia, unspecified organism: Secondary | ICD-10-CM | POA: Diagnosis not present

## 2013-10-13 DIAGNOSIS — E876 Hypokalemia: Secondary | ICD-10-CM | POA: Diagnosis not present

## 2013-10-13 DIAGNOSIS — R0902 Hypoxemia: Secondary | ICD-10-CM | POA: Diagnosis not present

## 2013-10-13 LAB — LEGIONELLA ANTIGEN, URINE: Legionella Antigen, Urine: NEGATIVE

## 2013-10-13 LAB — BASIC METABOLIC PANEL
BUN: 8 mg/dL (ref 6–23)
CO2: 28 mEq/L (ref 19–32)
Calcium: 7.8 mg/dL — ABNORMAL LOW (ref 8.4–10.5)
Chloride: 104 mEq/L (ref 96–112)
Creatinine, Ser: 0.84 mg/dL (ref 0.50–1.10)
GFR calc Af Amer: 79 mL/min — ABNORMAL LOW (ref >90)
GFR calc non Af Amer: 68 mL/min — ABNORMAL LOW (ref >90)
Glucose, Bld: 99 mg/dL (ref 70–99)
Potassium: 3.9 mEq/L (ref 3.7–5.3)
Sodium: 144 mEq/L (ref 137–147)

## 2013-10-13 MED ORDER — ALBUTEROL SULFATE HFA 108 (90 BASE) MCG/ACT IN AERS: 2.0000 | INHALATION_SPRAY | Freq: Four times a day (QID) | RESPIRATORY_TRACT | Status: DC | PRN

## 2013-10-13 MED ORDER — MOXIFLOXACIN HCL 400 MG PO TABS: 400.0000 mg | ORAL_TABLET | Freq: Every day | ORAL | Status: DC

## 2013-10-13 NOTE — Progress Notes (Signed)
UR completed Eirik Schueler K. Joylene Wescott, RN, BSN, MSHL, CCM  10/13/2013 4:59 PM

## 2013-10-13 NOTE — Progress Notes (Signed)
Patient evaluated for community based chronic disease management services with [facility] as a benefit of patient's [employer]. Spoke with patient at bedside to explain [facility] services.  Patient has declined services at this time.  Family will manage her care.  Left contact information and [facility] literature at bedside. Made Inpatient Case Manager aware that [facility] following. Of note, [facility] Management services does not replace or interfere with any services that are arranged by inpatient case management or social work.  For additional questions or referrals please contact Corliss Blacker BSN RN [facility] Liaison at [phone].

## 2013-10-13 NOTE — Discharge Summary (Signed)
Physician Discharge Summary  [facility] Daleen SquibbWall UEA:[id number]:[id number] DOB: [birth year] DOA: 10/11/2013  PCP: Sissy HoffSWAYNE,DAVID W, MD  Admit date: 10/11/2013 Discharge date: 10/13/2013  Time spent: >30 minutes  Recommendations for Outpatient Follow-up:  1. BMET to follow electrolytes and renal function  Discharge Diagnoses:  Acute resp failure with hypoxia Community acquired pneumonia Hypertension HLD Hypokalemia   Discharge Condition: stable and improved. Will discharge home; patient to follow with PCP in 10 days.  Diet recommendation: heart healthy/low sodium diet  Filed Weights   10/11/13 2256 10/12/13 0542 10/13/13 0703  Weight: 64.5 kg (142 lb 3.2 oz) 64.9 kg (143 lb 1.3 oz) 65.1 kg (143 lb 8.3 oz)    History of present illness:  73 year old female with history of hypertension was sent by her PCP for fever with shortness of breath and productive cough for past 3 days. Patient was in her usual state of health until 2 days back when she started having productive cough with whitish phlegm associated with generalized malaise , dyspnea on exertion and fever of 10 21F. She denies any headache, blurred vision, dizziness, chest pain, palpitations, nausea, vomiting, abdominal pain, bowel or urinary symptoms, joint pains. In the PCP office he was found to have oxygen saturation of 89% on room air.  She denies any sick contacts. Reports taking flu vaccine this flu season.   Hospital Course:  1-Acute resp failure with Hypoxia due to CAP: -resolving -good O2 sat on RA and good air movement -will discharge on PO antibiotics (Avelox  For 6 more days) -PRN albuterol -Patient will continue incentive spirometry and follow with PCP in 10 days.  2-HTN: -stable and well controlled -continue current regimen and low sodium diet  3-Hypokalemia: resolved. -K at discharge 3.9  4-HLD: continue statins  *Rest of medical problems remains stable and the plan is to continue current medication  regimen.  Procedures:  See below for x-ray reports  Consultations:  None   Discharge Exam: Filed Vitals:   10/13/13 0949  BP: 133/65  Pulse: 88  Temp: 97.8 F (36.6 C)  Resp: 20    General: afebrile, good air movement/O2 sat on RA and no SOB Cardiovascular: S1 and S2, no rubs or gallops Respiratory: CTA bilaterally Abd: soft, NT, ND, positive BS Neuro: non focal deficit   Discharge Instructions  Discharge Orders   Future Orders Complete By Expires   Diet - low sodium heart healthy  As directed    Discharge instructions  As directed    Comments:     Take medications as prescribed Arrange follow up with PCP in 10 days Follow a low sodium diet Continue using your incentive spirometry (at least 6 time a day, 8-10 repetitions during each session)       Medication List         albuterol 108 (90 BASE) MCG/ACT inhaler  Commonly known as:  PROVENTIL HFA;VENTOLIN HFA  Inhale 2 puffs into the lungs every 6 (six) hours as needed for wheezing or shortness of breath.     alendronate 70 MG tablet  Commonly known as:  FOSAMAX  Take 70 mg by mouth once a week. Take with a full glass of water on an empty stomach. Take on fridays or saturdays     amLODipine 5 MG tablet  Commonly known as:  NORVASC  Take 5 mg by mouth daily.     atenolol-chlorthalidone 100-25 MG per tablet  Commonly known as:  TENORETIC  Take 1 tablet by mouth daily.     atorvastatin  20 MG tablet  Commonly known as:  LIPITOR  Take 20 mg by mouth daily.     moxifloxacin 400 MG tablet  Commonly known as:  AVELOX  Take 1 tablet (400 mg total) by mouth daily at 8 pm.     Vitamin D 2000 UNITS Caps  Take by mouth.       Allergies  Allergen Reactions  . Tetanus Toxoids     Could not breath        Follow-up Information   Follow up with [name], MD. Schedule an appointment as soon as possible for a visit in 10 days.   Specialty:  Family Medicine   Contact information:   [address], [place] [address] [phone]        The results of significant diagnostics from this hospitalization (including imaging, microbiology, ancillary and laboratory) are listed below for reference.    Significant Diagnostic Studies: Dg Chest Port 1 View  10/11/2013   CLINICAL DATA:  Fever and shortness of breath.  EXAM: PORTABLE CHEST - 1 VIEW  COMPARISON:  12/05/2012  FINDINGS: Opacity at the left lung base may represent atelectasis versus subtle infiltrate. No edema is identified. The heart size is normal.  IMPRESSION: Left basilar atelectasis versus subtle infiltrate.   Electronically Signed   By: [name] M.D.   On: 10/11/2013 19:08    Microbiology: Recent Results (from the past 240 hour(s))  CULTURE, BLOOD (ROUTINE X 2)     Status: None   Collection Time    10/11/13  6:30 PM      Result Value Range Status   Specimen Description BLOOD FOREARM RIGHT   Final   Special Requests BOTTLES DRAWN AEROBIC AND ANAEROBIC 5CC   Final   Culture  Setup Time     Final   Value: 10/12/2013 01:15     Performed at [facility]   Culture     Final   Value:        BLOOD CULTURE RECEIVED NO GROWTH TO DATE CULTURE WILL BE HELD FOR 5 DAYS BEFORE ISSUING A FINAL NEGATIVE REPORT     Performed at [facility]   Report Status PENDING   Incomplete  CULTURE, BLOOD (ROUTINE X 2)     Status: None   Collection Time    10/11/13  7:20 PM      Result Value Range Status   Specimen Description BLOOD ARM RIGHT   Final   Special Requests BOTTLES DRAWN AEROBIC AND ANAEROBIC 10CC   Final   Culture  Setup Time     Final   Value: 10/12/2013 01:16     Performed at [facility]   Culture     Final   Value:        BLOOD CULTURE RECEIVED NO GROWTH TO DATE CULTURE WILL BE HELD FOR 5 DAYS BEFORE ISSUING A FINAL NEGATIVE REPORT     Performed at [facility]   Report Status PENDING   Incomplete     Labs: Basic Metabolic Panel:  Recent Labs Lab 10/11/13 1830  10/12/13 0507 10/12/13 2135 10/13/13 0558  NA 136* 139 137 144  K 2.8* 3.3* 4.0 3.9  CL 90* 97 98 104  CO2 29 28 27 28   GLUCOSE 104* 102* 106* 99  BUN 15 17 10 8   CREATININE 0.93 0.88 0.83 0.84  CALCIUM 8.9 7.8* 7.9* 7.8*  MG 1.7 1.8  --   --    Liver  Function Tests:  Recent Labs Lab 10/11/13 1830  AST 30  ALT 24  ALKPHOS 83  BILITOT 0.7  PROT 7.9  ALBUMIN 3.9   CBC:  Recent Labs Lab 10/11/13 1830  WBC 8.4  NEUTROABS 5.6  HGB 14.9  HCT 42.0  MCV 91.9  PLT 248    Signed:  Dee Paden  [facility] 10/13/2013, 2:38 PM

## 2013-10-13 NOTE — Progress Notes (Signed)
I cosign with Talmadge Chad Student RN on all assessments, notes, I/O, and medication passes for this shift. Ronnette Hila, RN

## 2013-10-13 NOTE — Care Management Note (Addendum)
  Page 1 of 1   10/13/2013     4:51:23 PM   CARE MANAGEMENT NOTE 10/13/2013  Patient:  Lauren Key, Lauren Key   Account Number:  [id number]  Date Initiated:  10/13/2013  Documentation initiated by:  Mariann Laster  Subjective/Objective Assessment:   admitted with PNE     Action/Plan:   follow for dispositon needs   Anticipated DC Date:  10/13/2013   Anticipated DC Plan:  HOME/SELF CARE         Choice offered to / List presented to:             Status of service:  Completed, signed off Medicare Important Message given?   (If response is "NO", the following Medicare IM given date fields will be blank) Date Medicare IM given:   Date Additional Medicare IM given:    Discharge Disposition:  HOME/SELF CARE  Per UR Regulation:  Reviewed for med. necessity/level of care/duration of stay  If discussed at [place] of Stay Meetings, dates discussed:    Comments:  10/13/2013 Hx/o [facility] - services declined d/c to home today [employer] RN, BSN, [place], [place] [phone] 10/13/2013

## 2013-10-18 LAB — CULTURE, BLOOD (ROUTINE X 2)
Culture: NO GROWTH
Culture: NO GROWTH

## 2013-10-19 DIAGNOSIS — R109 Unspecified abdominal pain: Secondary | ICD-10-CM | POA: Diagnosis not present

## 2013-10-19 DIAGNOSIS — I10 Essential (primary) hypertension: Secondary | ICD-10-CM | POA: Diagnosis not present

## 2013-10-19 DIAGNOSIS — J189 Pneumonia, unspecified organism: Secondary | ICD-10-CM | POA: Diagnosis not present

## 2013-10-19 DIAGNOSIS — E782 Mixed hyperlipidemia: Secondary | ICD-10-CM | POA: Diagnosis not present

## 2014-03-28 DIAGNOSIS — M899 Disorder of bone, unspecified: Secondary | ICD-10-CM | POA: Diagnosis not present

## 2014-03-28 DIAGNOSIS — M109 Gout, unspecified: Secondary | ICD-10-CM | POA: Diagnosis not present

## 2014-03-28 DIAGNOSIS — E782 Mixed hyperlipidemia: Secondary | ICD-10-CM | POA: Diagnosis not present

## 2014-03-28 DIAGNOSIS — Z8701 Personal history of pneumonia (recurrent): Secondary | ICD-10-CM | POA: Diagnosis not present

## 2014-03-28 DIAGNOSIS — M949 Disorder of cartilage, unspecified: Secondary | ICD-10-CM | POA: Diagnosis not present

## 2014-03-28 DIAGNOSIS — R0989 Other specified symptoms and signs involving the circulatory and respiratory systems: Secondary | ICD-10-CM | POA: Diagnosis not present

## 2014-03-28 DIAGNOSIS — Z1331 Encounter for screening for depression: Secondary | ICD-10-CM | POA: Diagnosis not present

## 2014-03-28 DIAGNOSIS — I10 Essential (primary) hypertension: Secondary | ICD-10-CM | POA: Diagnosis not present

## 2014-03-28 DIAGNOSIS — R7309 Other abnormal glucose: Secondary | ICD-10-CM | POA: Diagnosis not present

## 2014-03-28 DIAGNOSIS — E559 Vitamin D deficiency, unspecified: Secondary | ICD-10-CM | POA: Diagnosis not present

## 2014-05-26 DIAGNOSIS — M25559 Pain in unspecified hip: Secondary | ICD-10-CM | POA: Diagnosis not present

## 2014-05-26 DIAGNOSIS — D179 Benign lipomatous neoplasm, unspecified: Secondary | ICD-10-CM | POA: Diagnosis not present

## 2014-05-27 ENCOUNTER — Other Ambulatory Visit: Payer: Self-pay | Admitting: Family Medicine

## 2014-05-27 DIAGNOSIS — D179 Benign lipomatous neoplasm, unspecified: Secondary | ICD-10-CM

## 2014-06-01 ENCOUNTER — Ambulatory Visit
Admission: RE | Admit: 2014-06-01 | Discharge: 2014-06-01 | Disposition: A | Discharge status: Home / Self Care | Payer: Medicare Other | Source: Ambulatory Visit | Attending: Family Medicine | Admitting: Family Medicine

## 2014-06-01 DIAGNOSIS — M7989 Other specified soft tissue disorders: Secondary | ICD-10-CM | POA: Diagnosis not present

## 2014-06-01 DIAGNOSIS — D179 Benign lipomatous neoplasm, unspecified: Secondary | ICD-10-CM

## 2014-09-16 HISTORY — PX: OTHER SURGICAL HISTORY: SHX169

## 2015-04-13 DIAGNOSIS — Z1389 Encounter for screening for other disorder: Secondary | ICD-10-CM | POA: Diagnosis not present

## 2015-04-13 DIAGNOSIS — M859 Disorder of bone density and structure, unspecified: Secondary | ICD-10-CM | POA: Diagnosis not present

## 2015-04-13 DIAGNOSIS — M109 Gout, unspecified: Secondary | ICD-10-CM | POA: Diagnosis not present

## 2015-04-13 DIAGNOSIS — R7309 Other abnormal glucose: Secondary | ICD-10-CM | POA: Diagnosis not present

## 2015-04-13 DIAGNOSIS — Z1211 Encounter for screening for malignant neoplasm of colon: Secondary | ICD-10-CM | POA: Diagnosis not present

## 2015-04-13 DIAGNOSIS — J309 Allergic rhinitis, unspecified: Secondary | ICD-10-CM | POA: Diagnosis not present

## 2015-04-13 DIAGNOSIS — I10 Essential (primary) hypertension: Secondary | ICD-10-CM | POA: Diagnosis not present

## 2015-04-13 DIAGNOSIS — E559 Vitamin D deficiency, unspecified: Secondary | ICD-10-CM | POA: Diagnosis not present

## 2015-04-13 DIAGNOSIS — E782 Mixed hyperlipidemia: Secondary | ICD-10-CM | POA: Diagnosis not present

## 2015-04-13 DIAGNOSIS — Z0000 Encounter for general adult medical examination without abnormal findings: Secondary | ICD-10-CM | POA: Diagnosis not present

## 2015-04-17 DIAGNOSIS — H2513 Age-related nuclear cataract, bilateral: Secondary | ICD-10-CM | POA: Diagnosis not present

## 2015-04-17 DIAGNOSIS — Z135 Encounter for screening for eye and ear disorders: Secondary | ICD-10-CM | POA: Diagnosis not present

## 2015-04-17 DIAGNOSIS — H04123 Dry eye syndrome of bilateral lacrimal glands: Secondary | ICD-10-CM | POA: Diagnosis not present

## 2015-04-17 DIAGNOSIS — D3102 Benign neoplasm of left conjunctiva: Secondary | ICD-10-CM | POA: Diagnosis not present

## 2015-04-17 DIAGNOSIS — H5203 Hypermetropia, bilateral: Secondary | ICD-10-CM | POA: Diagnosis not present

## 2015-04-17 DIAGNOSIS — D3101 Benign neoplasm of right conjunctiva: Secondary | ICD-10-CM | POA: Diagnosis not present

## 2015-04-26 DIAGNOSIS — S90212A Contusion of left great toe with damage to nail, initial encounter: Secondary | ICD-10-CM | POA: Diagnosis not present

## 2015-04-26 DIAGNOSIS — B351 Tinea unguium: Secondary | ICD-10-CM | POA: Diagnosis not present

## 2015-05-11 DIAGNOSIS — B356 Tinea cruris: Secondary | ICD-10-CM | POA: Diagnosis not present

## 2015-05-30 DIAGNOSIS — Z1211 Encounter for screening for malignant neoplasm of colon: Secondary | ICD-10-CM | POA: Diagnosis not present

## 2015-05-30 DIAGNOSIS — K5730 Diverticulosis of large intestine without perforation or abscess without bleeding: Secondary | ICD-10-CM | POA: Diagnosis not present

## 2015-06-20 DIAGNOSIS — H02834 Dermatochalasis of left upper eyelid: Secondary | ICD-10-CM | POA: Diagnosis not present

## 2015-06-20 DIAGNOSIS — H527 Unspecified disorder of refraction: Secondary | ICD-10-CM | POA: Diagnosis not present

## 2015-06-20 DIAGNOSIS — H02831 Dermatochalasis of right upper eyelid: Secondary | ICD-10-CM | POA: Diagnosis not present

## 2015-06-20 DIAGNOSIS — H52223 Regular astigmatism, bilateral: Secondary | ICD-10-CM | POA: Diagnosis not present

## 2015-06-20 DIAGNOSIS — H25813 Combined forms of age-related cataract, bilateral: Secondary | ICD-10-CM | POA: Diagnosis not present

## 2015-06-20 DIAGNOSIS — D2311 Other benign neoplasm of skin of right eyelid, including canthus: Secondary | ICD-10-CM | POA: Diagnosis not present

## 2015-06-23 DIAGNOSIS — H02831 Dermatochalasis of right upper eyelid: Secondary | ICD-10-CM | POA: Diagnosis not present

## 2015-06-23 DIAGNOSIS — H527 Unspecified disorder of refraction: Secondary | ICD-10-CM | POA: Diagnosis not present

## 2015-06-23 DIAGNOSIS — H25813 Combined forms of age-related cataract, bilateral: Secondary | ICD-10-CM | POA: Diagnosis not present

## 2015-06-23 DIAGNOSIS — H02834 Dermatochalasis of left upper eyelid: Secondary | ICD-10-CM | POA: Diagnosis not present

## 2015-06-23 DIAGNOSIS — D2311 Other benign neoplasm of skin of right eyelid, including canthus: Secondary | ICD-10-CM | POA: Diagnosis not present

## 2015-06-23 DIAGNOSIS — H52223 Regular astigmatism, bilateral: Secondary | ICD-10-CM | POA: Diagnosis not present

## 2015-06-27 DIAGNOSIS — I10 Essential (primary) hypertension: Secondary | ICD-10-CM | POA: Diagnosis not present

## 2015-06-27 DIAGNOSIS — E785 Hyperlipidemia, unspecified: Secondary | ICD-10-CM | POA: Diagnosis not present

## 2015-06-27 DIAGNOSIS — H25812 Combined forms of age-related cataract, left eye: Secondary | ICD-10-CM | POA: Diagnosis not present

## 2015-06-27 DIAGNOSIS — H2512 Age-related nuclear cataract, left eye: Secondary | ICD-10-CM | POA: Diagnosis not present

## 2015-07-11 DIAGNOSIS — H2511 Age-related nuclear cataract, right eye: Secondary | ICD-10-CM | POA: Diagnosis not present

## 2015-07-11 DIAGNOSIS — H02831 Dermatochalasis of right upper eyelid: Secondary | ICD-10-CM | POA: Diagnosis not present

## 2015-07-11 DIAGNOSIS — H52223 Regular astigmatism, bilateral: Secondary | ICD-10-CM | POA: Diagnosis not present

## 2015-07-11 DIAGNOSIS — M810 Age-related osteoporosis without current pathological fracture: Secondary | ICD-10-CM | POA: Diagnosis not present

## 2015-07-11 DIAGNOSIS — H25811 Combined forms of age-related cataract, right eye: Secondary | ICD-10-CM | POA: Diagnosis not present

## 2015-07-11 DIAGNOSIS — I10 Essential (primary) hypertension: Secondary | ICD-10-CM | POA: Diagnosis not present

## 2015-07-11 DIAGNOSIS — E785 Hyperlipidemia, unspecified: Secondary | ICD-10-CM | POA: Diagnosis not present

## 2015-07-11 DIAGNOSIS — Z79899 Other long term (current) drug therapy: Secondary | ICD-10-CM | POA: Diagnosis not present

## 2015-09-19 DIAGNOSIS — J069 Acute upper respiratory infection, unspecified: Secondary | ICD-10-CM | POA: Diagnosis not present

## 2015-10-16 DIAGNOSIS — M8580 Other specified disorders of bone density and structure, unspecified site: Secondary | ICD-10-CM | POA: Diagnosis not present

## 2015-10-16 DIAGNOSIS — E559 Vitamin D deficiency, unspecified: Secondary | ICD-10-CM | POA: Diagnosis not present

## 2015-10-16 DIAGNOSIS — R7309 Other abnormal glucose: Secondary | ICD-10-CM | POA: Diagnosis not present

## 2015-10-16 DIAGNOSIS — M109 Gout, unspecified: Secondary | ICD-10-CM | POA: Diagnosis not present

## 2015-10-16 DIAGNOSIS — E782 Mixed hyperlipidemia: Secondary | ICD-10-CM | POA: Diagnosis not present

## 2015-10-16 DIAGNOSIS — R7303 Prediabetes: Secondary | ICD-10-CM | POA: Diagnosis not present

## 2015-10-16 DIAGNOSIS — J309 Allergic rhinitis, unspecified: Secondary | ICD-10-CM | POA: Diagnosis not present

## 2015-10-16 DIAGNOSIS — I10 Essential (primary) hypertension: Secondary | ICD-10-CM | POA: Diagnosis not present

## 2015-11-14 DIAGNOSIS — M8589 Other specified disorders of bone density and structure, multiple sites: Secondary | ICD-10-CM | POA: Diagnosis not present

## 2015-11-14 DIAGNOSIS — M859 Disorder of bone density and structure, unspecified: Secondary | ICD-10-CM | POA: Diagnosis not present

## 2015-11-20 DIAGNOSIS — Z1239 Encounter for other screening for malignant neoplasm of breast: Secondary | ICD-10-CM | POA: Diagnosis not present

## 2015-11-20 DIAGNOSIS — E782 Mixed hyperlipidemia: Secondary | ICD-10-CM | POA: Diagnosis not present

## 2015-11-20 DIAGNOSIS — M109 Gout, unspecified: Secondary | ICD-10-CM | POA: Diagnosis not present

## 2015-11-20 DIAGNOSIS — E559 Vitamin D deficiency, unspecified: Secondary | ICD-10-CM | POA: Diagnosis not present

## 2015-11-20 DIAGNOSIS — I10 Essential (primary) hypertension: Secondary | ICD-10-CM | POA: Diagnosis not present

## 2015-11-20 DIAGNOSIS — E876 Hypokalemia: Secondary | ICD-10-CM | POA: Diagnosis not present

## 2015-11-20 DIAGNOSIS — M8580 Other specified disorders of bone density and structure, unspecified site: Secondary | ICD-10-CM | POA: Diagnosis not present

## 2015-11-20 DIAGNOSIS — R7303 Prediabetes: Secondary | ICD-10-CM | POA: Diagnosis not present

## 2015-11-20 DIAGNOSIS — J309 Allergic rhinitis, unspecified: Secondary | ICD-10-CM | POA: Diagnosis not present

## 2015-12-04 DIAGNOSIS — R921 Mammographic calcification found on diagnostic imaging of breast: Secondary | ICD-10-CM | POA: Diagnosis not present

## 2015-12-04 DIAGNOSIS — Z1231 Encounter for screening mammogram for malignant neoplasm of breast: Secondary | ICD-10-CM | POA: Diagnosis not present

## 2015-12-18 DIAGNOSIS — R920 Mammographic microcalcification found on diagnostic imaging of breast: Secondary | ICD-10-CM | POA: Diagnosis not present

## 2015-12-18 DIAGNOSIS — R928 Other abnormal and inconclusive findings on diagnostic imaging of breast: Secondary | ICD-10-CM | POA: Diagnosis not present

## 2015-12-27 DIAGNOSIS — R928 Other abnormal and inconclusive findings on diagnostic imaging of breast: Secondary | ICD-10-CM | POA: Diagnosis not present

## 2015-12-27 DIAGNOSIS — Z6829 Body mass index (BMI) 29.0-29.9, adult: Secondary | ICD-10-CM | POA: Diagnosis not present

## 2015-12-27 DIAGNOSIS — G250 Essential tremor: Secondary | ICD-10-CM | POA: Diagnosis not present

## 2016-01-02 DIAGNOSIS — N6092 Unspecified benign mammary dysplasia of left breast: Secondary | ICD-10-CM | POA: Diagnosis not present

## 2016-01-02 DIAGNOSIS — N6489 Other specified disorders of breast: Secondary | ICD-10-CM | POA: Diagnosis not present

## 2016-01-02 DIAGNOSIS — R920 Mammographic microcalcification found on diagnostic imaging of breast: Secondary | ICD-10-CM | POA: Diagnosis not present

## 2016-01-02 DIAGNOSIS — D242 Benign neoplasm of left breast: Secondary | ICD-10-CM | POA: Diagnosis not present

## 2016-01-08 DIAGNOSIS — R920 Mammographic microcalcification found on diagnostic imaging of breast: Secondary | ICD-10-CM | POA: Diagnosis not present

## 2016-01-08 DIAGNOSIS — Z1231 Encounter for screening mammogram for malignant neoplasm of breast: Secondary | ICD-10-CM | POA: Diagnosis not present

## 2016-01-08 DIAGNOSIS — R928 Other abnormal and inconclusive findings on diagnostic imaging of breast: Secondary | ICD-10-CM | POA: Diagnosis not present

## 2016-02-28 DIAGNOSIS — Z6830 Body mass index (BMI) 30.0-30.9, adult: Secondary | ICD-10-CM | POA: Diagnosis not present

## 2016-02-28 DIAGNOSIS — G250 Essential tremor: Secondary | ICD-10-CM | POA: Diagnosis not present

## 2016-03-08 DIAGNOSIS — H43311 Vitreous membranes and strands, right eye: Secondary | ICD-10-CM | POA: Diagnosis not present

## 2016-03-14 DIAGNOSIS — H43811 Vitreous degeneration, right eye: Secondary | ICD-10-CM | POA: Diagnosis not present

## 2016-04-17 DIAGNOSIS — H43811 Vitreous degeneration, right eye: Secondary | ICD-10-CM | POA: Diagnosis not present

## 2016-04-29 DIAGNOSIS — M109 Gout, unspecified: Secondary | ICD-10-CM | POA: Diagnosis not present

## 2016-04-29 DIAGNOSIS — I10 Essential (primary) hypertension: Secondary | ICD-10-CM | POA: Diagnosis not present

## 2016-04-29 DIAGNOSIS — Z1211 Encounter for screening for malignant neoplasm of colon: Secondary | ICD-10-CM | POA: Diagnosis not present

## 2016-04-29 DIAGNOSIS — E559 Vitamin D deficiency, unspecified: Secondary | ICD-10-CM | POA: Diagnosis not present

## 2016-04-29 DIAGNOSIS — J309 Allergic rhinitis, unspecified: Secondary | ICD-10-CM | POA: Diagnosis not present

## 2016-04-29 DIAGNOSIS — Z1389 Encounter for screening for other disorder: Secondary | ICD-10-CM | POA: Diagnosis not present

## 2016-04-29 DIAGNOSIS — Z0000 Encounter for general adult medical examination without abnormal findings: Secondary | ICD-10-CM | POA: Diagnosis not present

## 2016-04-29 DIAGNOSIS — R7301 Impaired fasting glucose: Secondary | ICD-10-CM | POA: Diagnosis not present

## 2016-04-29 DIAGNOSIS — R251 Tremor, unspecified: Secondary | ICD-10-CM | POA: Diagnosis not present

## 2016-04-29 DIAGNOSIS — M8580 Other specified disorders of bone density and structure, unspecified site: Secondary | ICD-10-CM | POA: Diagnosis not present

## 2016-04-29 DIAGNOSIS — E782 Mixed hyperlipidemia: Secondary | ICD-10-CM | POA: Diagnosis not present

## 2016-05-30 DIAGNOSIS — Z6828 Body mass index (BMI) 28.0-28.9, adult: Secondary | ICD-10-CM | POA: Diagnosis not present

## 2016-05-30 DIAGNOSIS — G250 Essential tremor: Secondary | ICD-10-CM | POA: Diagnosis not present

## 2016-08-06 DIAGNOSIS — I10 Essential (primary) hypertension: Secondary | ICD-10-CM | POA: Diagnosis not present

## 2016-08-06 DIAGNOSIS — G250 Essential tremor: Secondary | ICD-10-CM | POA: Diagnosis not present

## 2016-08-06 DIAGNOSIS — J209 Acute bronchitis, unspecified: Secondary | ICD-10-CM | POA: Diagnosis not present

## 2016-09-26 DIAGNOSIS — R6889 Other general symptoms and signs: Secondary | ICD-10-CM | POA: Diagnosis not present

## 2016-09-26 DIAGNOSIS — J069 Acute upper respiratory infection, unspecified: Secondary | ICD-10-CM | POA: Diagnosis not present

## 2016-10-14 DIAGNOSIS — H52203 Unspecified astigmatism, bilateral: Secondary | ICD-10-CM | POA: Diagnosis not present

## 2016-10-14 DIAGNOSIS — H43811 Vitreous degeneration, right eye: Secondary | ICD-10-CM | POA: Diagnosis not present

## 2016-10-14 DIAGNOSIS — Z961 Presence of intraocular lens: Secondary | ICD-10-CM | POA: Diagnosis not present

## 2016-10-31 DIAGNOSIS — E559 Vitamin D deficiency, unspecified: Secondary | ICD-10-CM | POA: Diagnosis not present

## 2016-10-31 DIAGNOSIS — R7303 Prediabetes: Secondary | ICD-10-CM | POA: Diagnosis not present

## 2016-10-31 DIAGNOSIS — E782 Mixed hyperlipidemia: Secondary | ICD-10-CM | POA: Diagnosis not present

## 2016-10-31 DIAGNOSIS — I10 Essential (primary) hypertension: Secondary | ICD-10-CM | POA: Diagnosis not present

## 2016-10-31 DIAGNOSIS — R251 Tremor, unspecified: Secondary | ICD-10-CM | POA: Diagnosis not present

## 2016-10-31 DIAGNOSIS — M109 Gout, unspecified: Secondary | ICD-10-CM | POA: Diagnosis not present

## 2016-10-31 DIAGNOSIS — J309 Allergic rhinitis, unspecified: Secondary | ICD-10-CM | POA: Diagnosis not present

## 2016-10-31 DIAGNOSIS — M8580 Other specified disorders of bone density and structure, unspecified site: Secondary | ICD-10-CM | POA: Diagnosis not present

## 2016-11-01 DIAGNOSIS — W19XXXA Unspecified fall, initial encounter: Secondary | ICD-10-CM | POA: Diagnosis not present

## 2016-11-01 DIAGNOSIS — S8392XA Sprain of unspecified site of left knee, initial encounter: Secondary | ICD-10-CM | POA: Diagnosis not present

## 2016-11-21 ENCOUNTER — Other Ambulatory Visit: Payer: Self-pay | Admitting: Family Medicine

## 2016-11-21 ENCOUNTER — Ambulatory Visit
Admission: RE | Admit: 2016-11-21 | Discharge: 2016-11-21 | Disposition: A | Discharge status: Home / Self Care | Payer: Medicare Other | Source: Ambulatory Visit | Attending: Family Medicine | Admitting: Family Medicine

## 2016-11-21 DIAGNOSIS — M47814 Spondylosis without myelopathy or radiculopathy, thoracic region: Secondary | ICD-10-CM | POA: Diagnosis not present

## 2016-11-21 DIAGNOSIS — R0781 Pleurodynia: Secondary | ICD-10-CM

## 2016-11-21 DIAGNOSIS — M546 Pain in thoracic spine: Secondary | ICD-10-CM

## 2016-11-28 DIAGNOSIS — G250 Essential tremor: Secondary | ICD-10-CM | POA: Diagnosis not present

## 2016-11-28 DIAGNOSIS — Z9181 History of falling: Secondary | ICD-10-CM | POA: Diagnosis not present

## 2016-11-28 DIAGNOSIS — Z6829 Body mass index (BMI) 29.0-29.9, adult: Secondary | ICD-10-CM | POA: Diagnosis not present

## 2016-12-30 DIAGNOSIS — Z1231 Encounter for screening mammogram for malignant neoplasm of breast: Secondary | ICD-10-CM | POA: Diagnosis not present

## 2017-01-06 DIAGNOSIS — Z6829 Body mass index (BMI) 29.0-29.9, adult: Secondary | ICD-10-CM | POA: Diagnosis not present

## 2017-01-06 DIAGNOSIS — R920 Mammographic microcalcification found on diagnostic imaging of breast: Secondary | ICD-10-CM | POA: Diagnosis not present

## 2017-05-22 DIAGNOSIS — E559 Vitamin D deficiency, unspecified: Secondary | ICD-10-CM | POA: Diagnosis not present

## 2017-05-22 DIAGNOSIS — Z1389 Encounter for screening for other disorder: Secondary | ICD-10-CM | POA: Diagnosis not present

## 2017-05-22 DIAGNOSIS — J309 Allergic rhinitis, unspecified: Secondary | ICD-10-CM | POA: Diagnosis not present

## 2017-05-22 DIAGNOSIS — R7303 Prediabetes: Secondary | ICD-10-CM | POA: Diagnosis not present

## 2017-05-22 DIAGNOSIS — M109 Gout, unspecified: Secondary | ICD-10-CM | POA: Diagnosis not present

## 2017-05-22 DIAGNOSIS — R251 Tremor, unspecified: Secondary | ICD-10-CM | POA: Diagnosis not present

## 2017-05-22 DIAGNOSIS — I10 Essential (primary) hypertension: Secondary | ICD-10-CM | POA: Diagnosis not present

## 2017-05-22 DIAGNOSIS — Z0000 Encounter for general adult medical examination without abnormal findings: Secondary | ICD-10-CM | POA: Diagnosis not present

## 2017-05-22 DIAGNOSIS — E782 Mixed hyperlipidemia: Secondary | ICD-10-CM | POA: Diagnosis not present

## 2017-05-22 DIAGNOSIS — Z1211 Encounter for screening for malignant neoplasm of colon: Secondary | ICD-10-CM | POA: Diagnosis not present

## 2017-05-22 DIAGNOSIS — M7989 Other specified soft tissue disorders: Secondary | ICD-10-CM | POA: Diagnosis not present

## 2017-05-22 DIAGNOSIS — M85852 Other specified disorders of bone density and structure, left thigh: Secondary | ICD-10-CM | POA: Diagnosis not present

## 2017-07-12 DIAGNOSIS — B349 Viral infection, unspecified: Secondary | ICD-10-CM | POA: Diagnosis not present

## 2017-07-29 IMAGING — CR DG RIBS W/ CHEST 3+V*R*
3 series · 3 of 3 positions shown · non-contrast
Comparison: Chest x-ray of 10/11/2013.

CLINICAL DATA: Right lower posterior rib pain for 1 week

EXAM:
RIGHT RIBS AND CHEST - 3+ VIEW

[w chest pa]
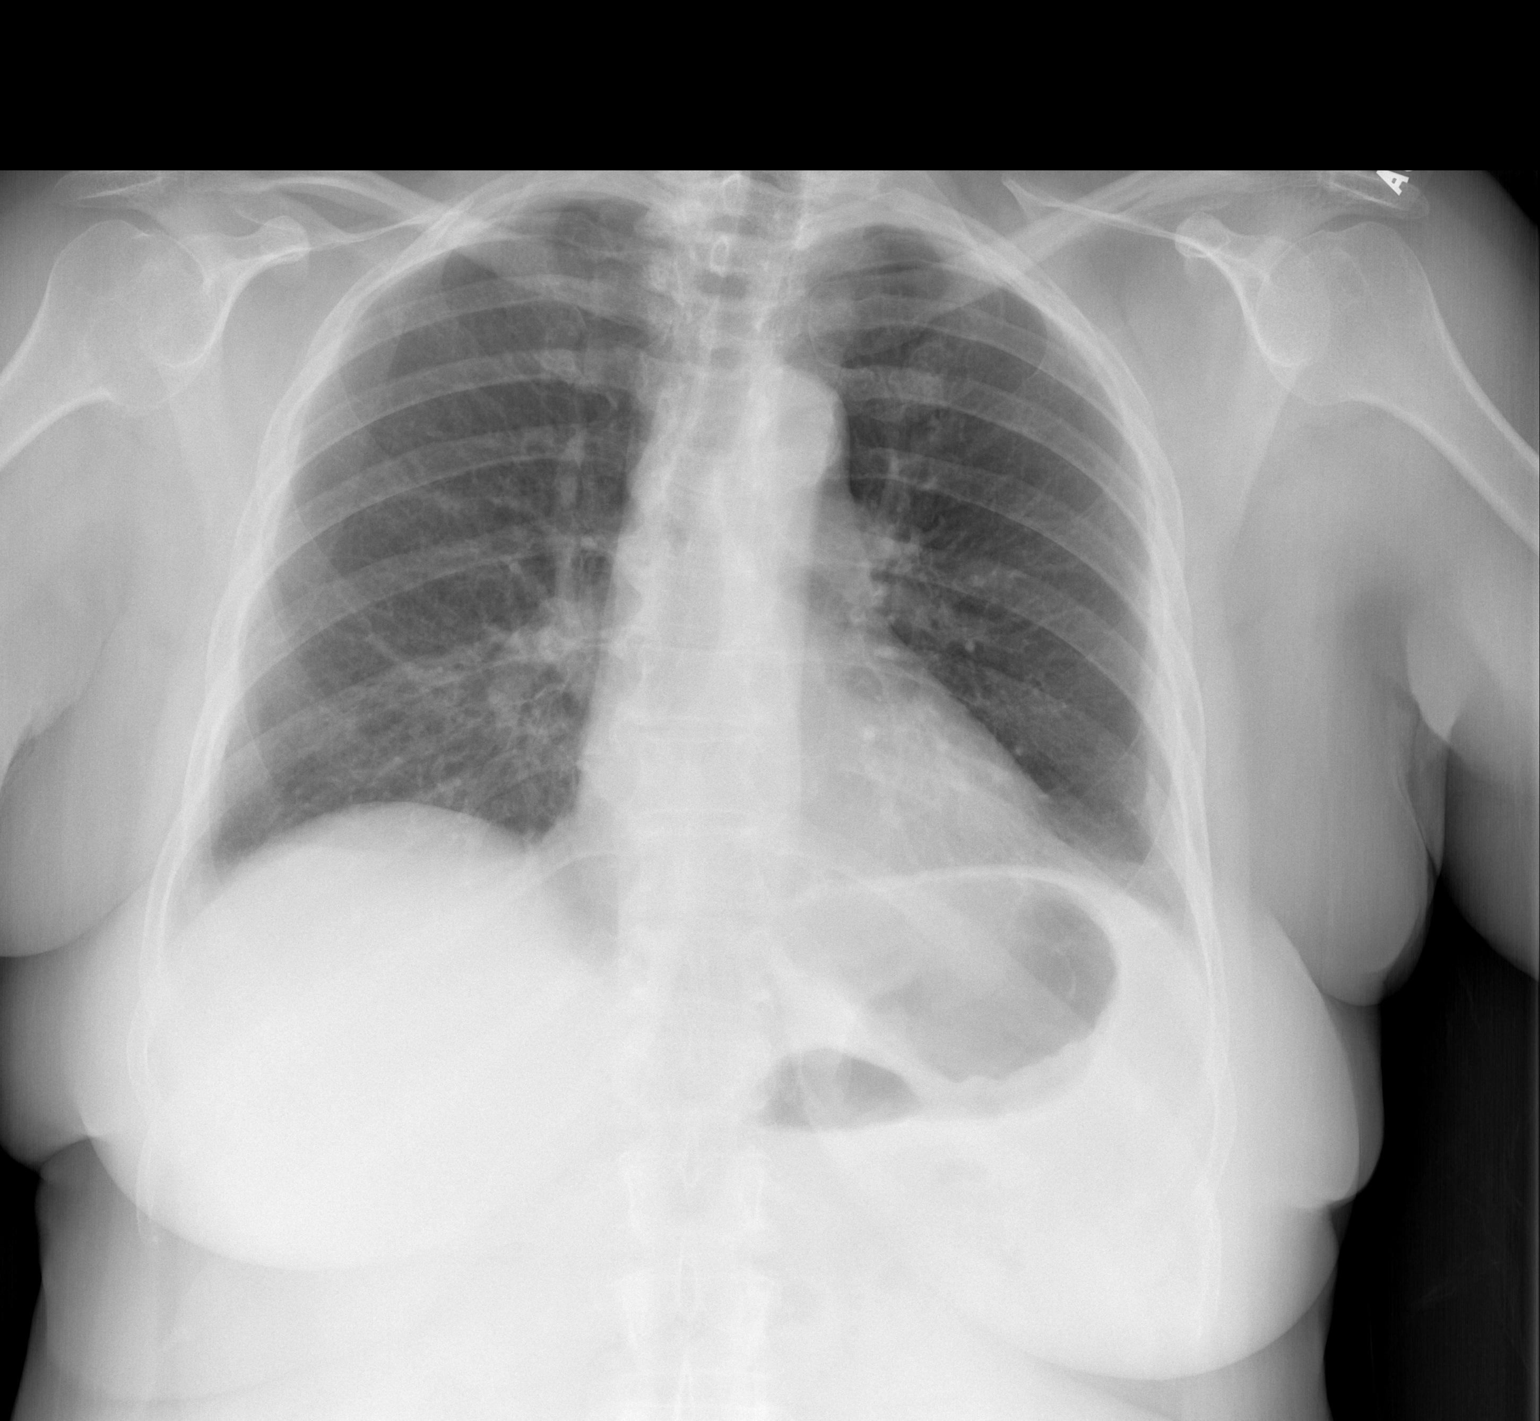

[w ribs ap/pa lower right *]
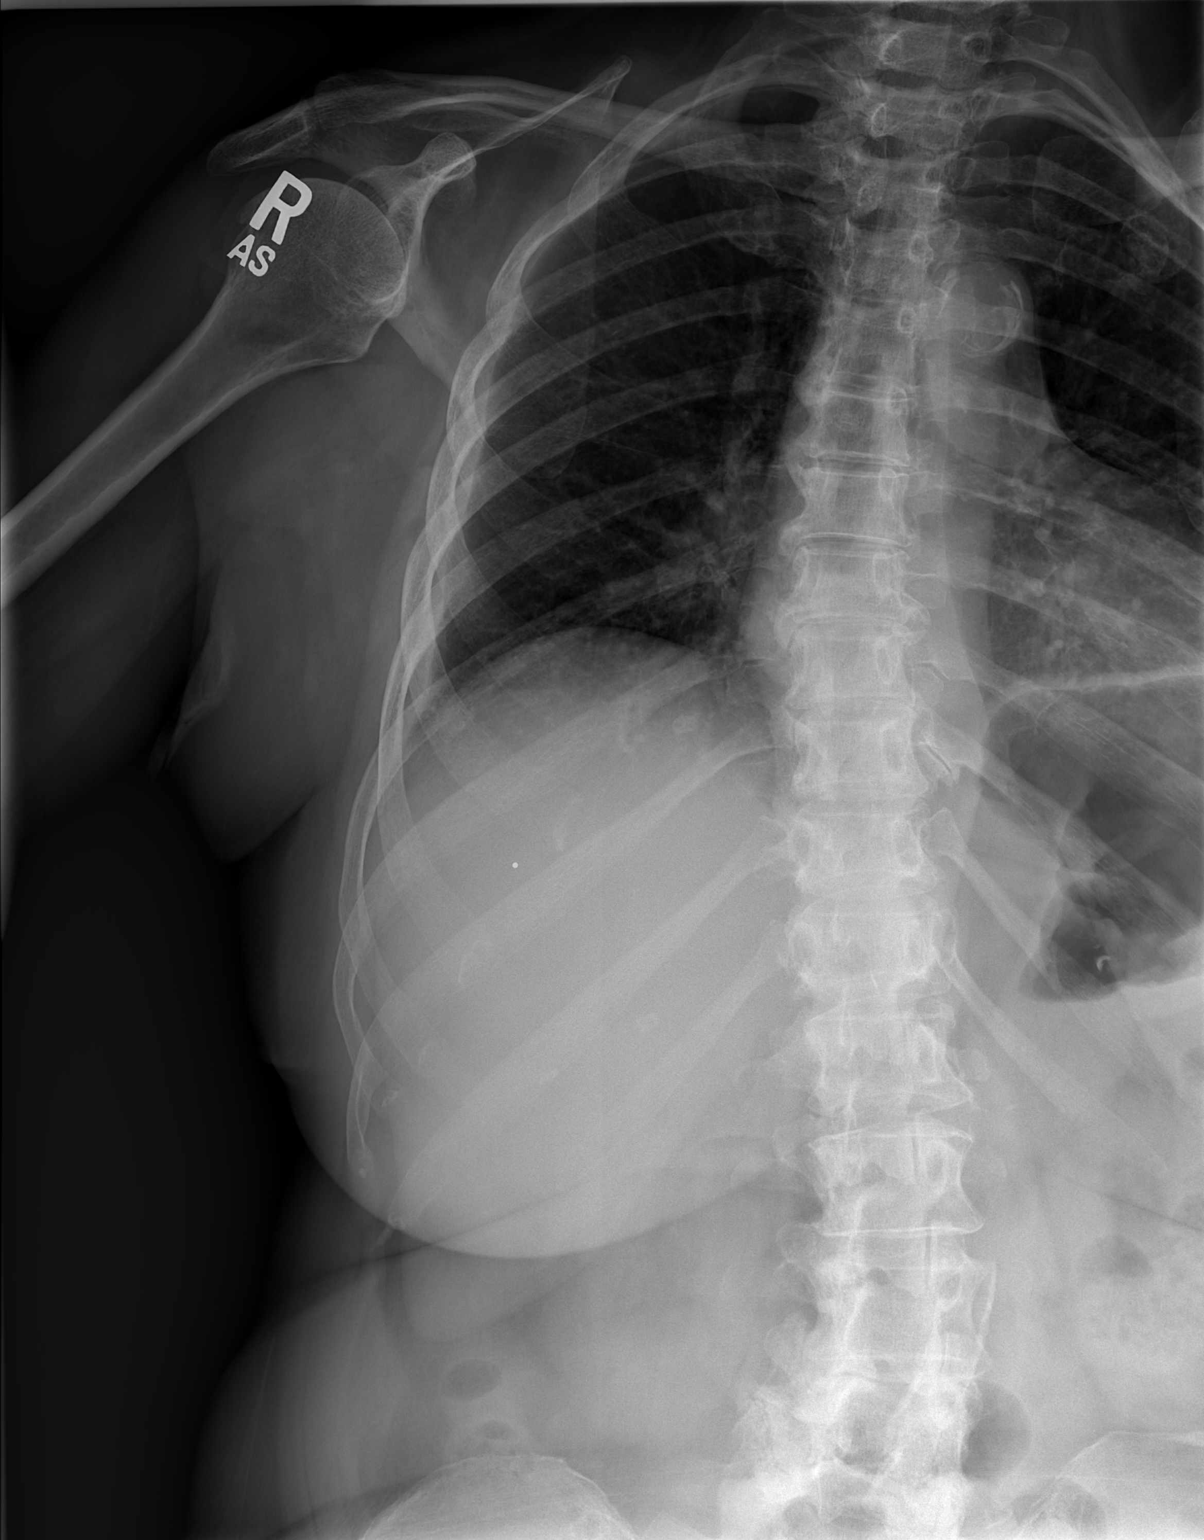

[w ribs oblique right *]
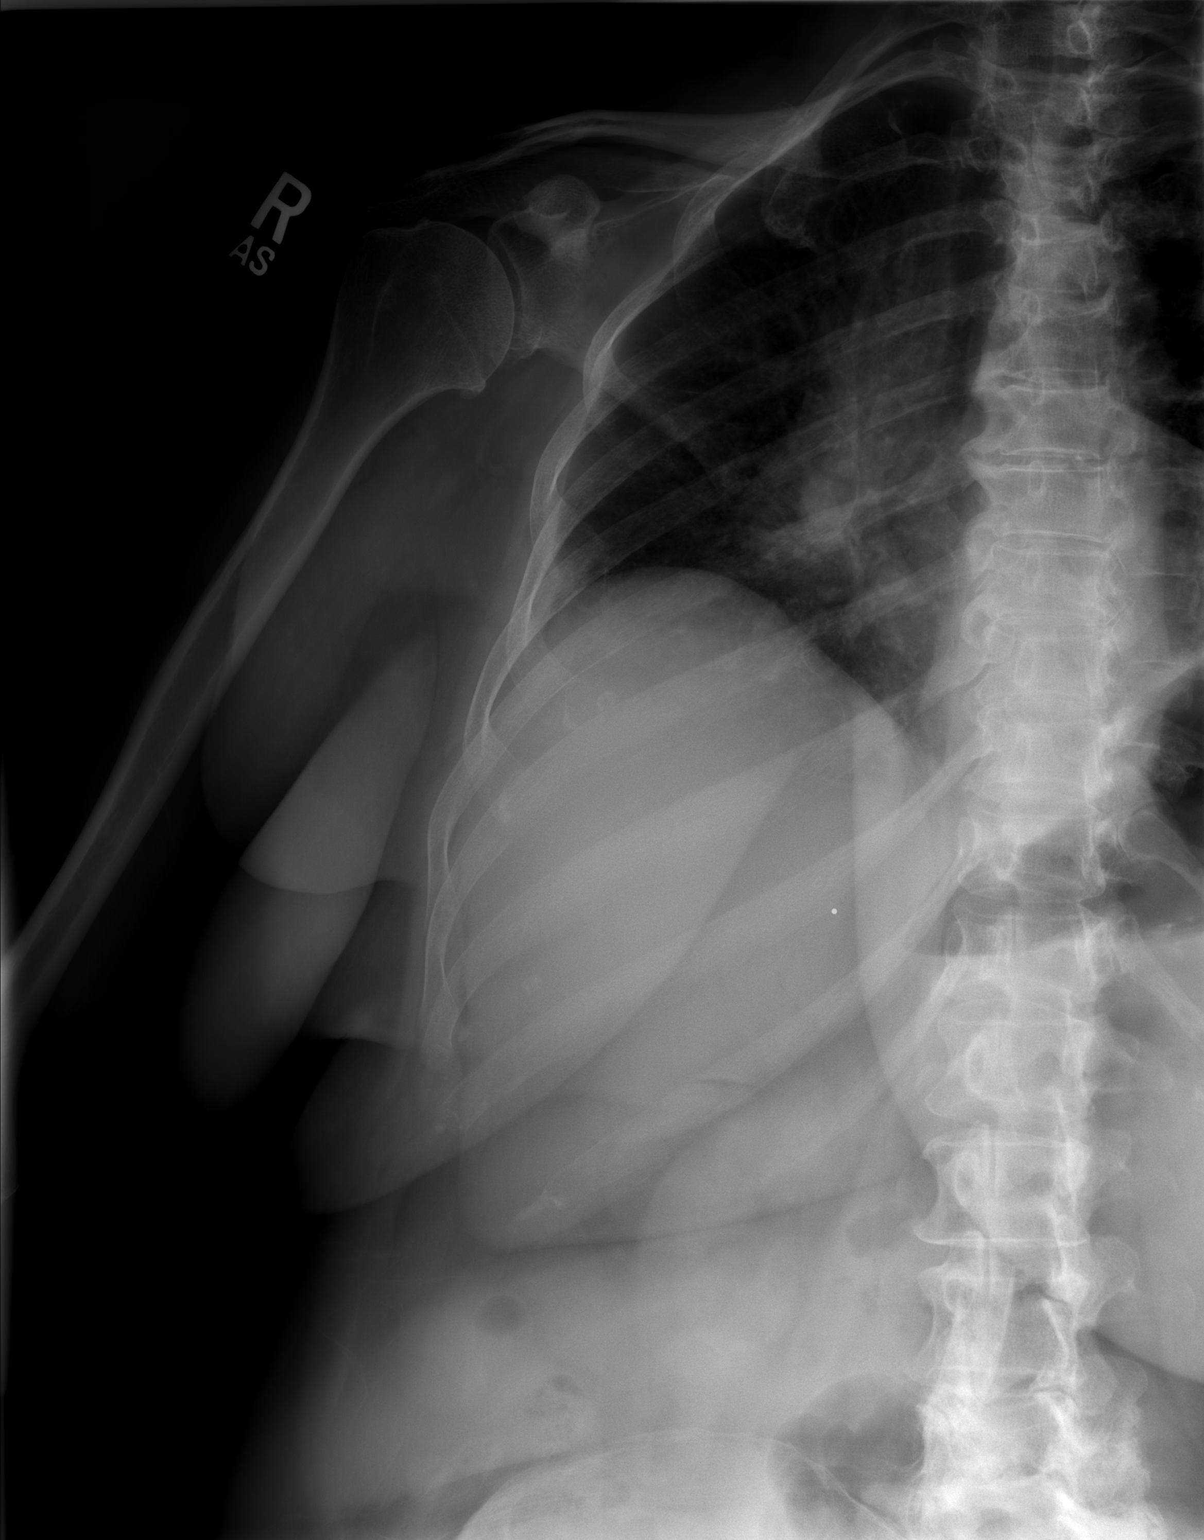

[3 of 3 positions shown; findings below may reference images not displayed]

FINDINGS: No active infiltrate or effusion is seen. Mediastinal and hilar
contours are unremarkable. The heart is within normal limits in
size. Right rib detail films show no acute right rib fracture
IMPRESSION: 1. No active lung disease.
2. Negative right rib detail.

## 2017-08-21 DIAGNOSIS — R6889 Other general symptoms and signs: Secondary | ICD-10-CM | POA: Diagnosis not present

## 2017-08-21 DIAGNOSIS — J209 Acute bronchitis, unspecified: Secondary | ICD-10-CM | POA: Diagnosis not present

## 2017-12-02 DIAGNOSIS — J309 Allergic rhinitis, unspecified: Secondary | ICD-10-CM | POA: Diagnosis not present

## 2017-12-02 DIAGNOSIS — E559 Vitamin D deficiency, unspecified: Secondary | ICD-10-CM | POA: Diagnosis not present

## 2017-12-02 DIAGNOSIS — R251 Tremor, unspecified: Secondary | ICD-10-CM | POA: Diagnosis not present

## 2017-12-02 DIAGNOSIS — M109 Gout, unspecified: Secondary | ICD-10-CM | POA: Diagnosis not present

## 2017-12-02 DIAGNOSIS — E782 Mixed hyperlipidemia: Secondary | ICD-10-CM | POA: Diagnosis not present

## 2017-12-02 DIAGNOSIS — M85852 Other specified disorders of bone density and structure, left thigh: Secondary | ICD-10-CM | POA: Diagnosis not present

## 2017-12-02 DIAGNOSIS — I10 Essential (primary) hypertension: Secondary | ICD-10-CM | POA: Diagnosis not present

## 2017-12-02 DIAGNOSIS — R7303 Prediabetes: Secondary | ICD-10-CM | POA: Diagnosis not present

## 2018-01-19 DIAGNOSIS — Z961 Presence of intraocular lens: Secondary | ICD-10-CM | POA: Diagnosis not present

## 2018-01-19 DIAGNOSIS — D239 Other benign neoplasm of skin, unspecified: Secondary | ICD-10-CM | POA: Diagnosis not present

## 2018-01-19 DIAGNOSIS — H43811 Vitreous degeneration, right eye: Secondary | ICD-10-CM | POA: Diagnosis not present

## 2018-01-19 DIAGNOSIS — D23121 Other benign neoplasm of skin of left upper eyelid, including canthus: Secondary | ICD-10-CM | POA: Diagnosis not present

## 2018-01-19 DIAGNOSIS — H52203 Unspecified astigmatism, bilateral: Secondary | ICD-10-CM | POA: Diagnosis not present

## 2018-01-27 DIAGNOSIS — M8588 Other specified disorders of bone density and structure, other site: Secondary | ICD-10-CM | POA: Diagnosis not present

## 2018-02-03 DIAGNOSIS — D492 Neoplasm of unspecified behavior of bone, soft tissue, and skin: Secondary | ICD-10-CM | POA: Diagnosis not present

## 2018-02-03 DIAGNOSIS — L821 Other seborrheic keratosis: Secondary | ICD-10-CM | POA: Diagnosis not present

## 2018-04-04 ENCOUNTER — Encounter (HOSPITAL_BASED_OUTPATIENT_CLINIC_OR_DEPARTMENT_OTHER): Payer: Self-pay | Admitting: *Deleted

## 2018-04-04 ENCOUNTER — Other Ambulatory Visit: Payer: Self-pay

## 2018-04-04 ENCOUNTER — Emergency Department (HOSPITAL_BASED_OUTPATIENT_CLINIC_OR_DEPARTMENT_OTHER)
Admission: EM | Admit: 2018-04-04 | Discharge: 2018-04-05 | Disposition: A | Discharge status: Home / Self Care | Payer: Medicare Other | Attending: Emergency Medicine | Admitting: Emergency Medicine

## 2018-04-04 DIAGNOSIS — S42202A Unspecified fracture of upper end of left humerus, initial encounter for closed fracture: Secondary | ICD-10-CM | POA: Insufficient documentation

## 2018-04-04 DIAGNOSIS — W010XXA Fall on same level from slipping, tripping and stumbling without subsequent striking against object, initial encounter: Secondary | ICD-10-CM | POA: Diagnosis not present

## 2018-04-04 DIAGNOSIS — Y9289 Other specified places as the place of occurrence of the external cause: Secondary | ICD-10-CM | POA: Insufficient documentation

## 2018-04-04 DIAGNOSIS — Z79899 Other long term (current) drug therapy: Secondary | ICD-10-CM | POA: Diagnosis not present

## 2018-04-04 DIAGNOSIS — S42212A Unspecified displaced fracture of surgical neck of left humerus, initial encounter for closed fracture: Secondary | ICD-10-CM | POA: Diagnosis not present

## 2018-04-04 DIAGNOSIS — Y998 Other external cause status: Secondary | ICD-10-CM | POA: Diagnosis not present

## 2018-04-04 DIAGNOSIS — I10 Essential (primary) hypertension: Secondary | ICD-10-CM | POA: Insufficient documentation

## 2018-04-04 DIAGNOSIS — S4992XA Unspecified injury of left shoulder and upper arm, initial encounter: Secondary | ICD-10-CM | POA: Diagnosis present

## 2018-04-04 DIAGNOSIS — Y9301 Activity, walking, marching and hiking: Secondary | ICD-10-CM | POA: Insufficient documentation

## 2018-04-04 NOTE — ED Triage Notes (Signed)
Pt reports fell off curb and landed on left arm. ?dislocation

## 2018-04-05 ENCOUNTER — Emergency Department (HOSPITAL_BASED_OUTPATIENT_CLINIC_OR_DEPARTMENT_OTHER): Payer: Medicare Other

## 2018-04-05 DIAGNOSIS — S42202A Unspecified fracture of upper end of left humerus, initial encounter for closed fracture: Secondary | ICD-10-CM | POA: Diagnosis not present

## 2018-04-05 DIAGNOSIS — S42212A Unspecified displaced fracture of surgical neck of left humerus, initial encounter for closed fracture: Secondary | ICD-10-CM | POA: Diagnosis not present

## 2018-04-05 MED ORDER — ONDANSETRON 4 MG PO TBDP
ORAL_TABLET | ORAL | Status: AC
  Administered 2018-04-05: 4 mg via ORAL
  Filled 2018-04-05: qty 1

## 2018-04-05 MED ORDER — HYDROCODONE-ACETAMINOPHEN 5-325 MG PO TABS
2.0000 | ORAL_TABLET | Freq: Once | ORAL | Status: AC
  Administered 2018-04-05: 2 via ORAL
  Filled 2018-04-05: qty 2

## 2018-04-05 MED ORDER — HYDROCODONE-ACETAMINOPHEN 5-325 MG PO TABS: 1.0000 | ORAL_TABLET | Freq: Four times a day (QID) | ORAL | 0 refills | Status: DC | PRN

## 2018-04-05 MED ORDER — ONDANSETRON 4 MG PO TBDP
4.0000 mg | ORAL_TABLET | Freq: Once | ORAL | Status: AC
  Administered 2018-04-05: 4 mg via ORAL

## 2018-04-05 NOTE — Discharge Instructions (Addendum)
Wear sling and swath until followed up by orthopedics.  Hydrocodone is prescribed as needed for pain.  Follow-up with orthopedic surgery in the next week.  The contact information for Dr. Lyla Glassing has been provided in this discharge summary for you to call and make these arrangements.

## 2018-04-05 NOTE — ED Provider Notes (Signed)
[facility] Provider Note   CSN: [id number] Arrival date & time: 04/04/18  2347     History   Chief Complaint No chief complaint on file.   HPI Lauren Key is a 77 y.o. female.  Patient is a 77 year old female with history of hypertension presenting for evaluation of fall.  She was crossing the street and tripped over an uneven median causing her to fall forward.  She injured her left shoulder.  She denies any numbness or tingling.  She denies other injury.  The history is provided by the patient.  Shoulder Injury  This is a new problem. The current episode started 1 to 2 hours ago. The problem occurs constantly. The problem has not changed since onset.Exacerbated by: Movement and palpation. Nothing relieves the symptoms. She has tried nothing for the symptoms.    Past Medical History:  Diagnosis Date  . Hypertension     Patient Active Problem List   Diagnosis Date Noted  . Sepsis ([place]) 10/12/2013  . Community acquired pneumonia 10/11/2013  . Hypertension 10/11/2013  . Hypokalemia 10/11/2013    Past Surgical History:  Procedure Laterality Date  . CESAREAN SECTION       OB History   None      Home Medications    Prior to Admission medications   Medication Sig Start Date End Date Taking? Authorizing Provider  albuterol (PROVENTIL HFA;VENTOLIN HFA) 108 (90 BASE) MCG/ACT inhaler Inhale 2 puffs into the lungs every 6 (six) hours as needed for wheezing or shortness of breath. 10/13/13   Barton Dubois, MD  alendronate (FOSAMAX) 70 MG tablet Take 70 mg by mouth once a week. Take with a full glass of water on an empty stomach. Take on fridays or saturdays    [provider]  amLODipine (NORVASC) 5 MG tablet Take 5 mg by mouth daily.    [provider]  atenolol-chlorthalidone (TENORETIC) 100-25 MG per tablet Take 1 tablet by mouth daily.    [provider]  atorvastatin (LIPITOR) 20 MG tablet Take 20 mg by  mouth daily.    [provider]  Cholecalciferol (VITAMIN D) 2000 UNITS CAPS Take by mouth.    [provider]  moxifloxacin (AVELOX) 400 MG tablet Take 1 tablet (400 mg total) by mouth daily at 8 pm. 10/13/13   Barton Dubois, MD    Family History No family history on file.  Social History Social History   Tobacco Use  . Smoking status: Never Smoker  . Smokeless tobacco: Never Used  Substance Use Topics  . Alcohol use: No  . Drug use: No     Allergies   Tetanus toxoids   Review of Systems Review of Systems  All other systems reviewed and are negative.    Physical Exam Updated Vital Signs BP (!) 151/66 (BP Location: Left Arm)   Pulse 61   Temp 98.1 F (36.7 C) (Oral)   Resp 18   SpO2 96%   Physical Exam  Constitutional: She is oriented to person, place, and time. She appears well-developed and well-nourished. No distress.  HENT:  Head: Normocephalic and atraumatic.  Neck: Normal range of motion. Neck supple.  Pulmonary/Chest: Effort normal.  Musculoskeletal:  There is no obvious deformity or ecchymosis of the shoulder/upper humerus.  There is tenderness to palpation over the deltoid.  Ulnar and radial pulses are easily palpable.  She is able to flex and extend all fingers and sensation is intact throughout the entire hand.  Neurological: She is alert and oriented to person, place, and time.  Skin: Skin is warm and dry. She is not diaphoretic.  Nursing note and vitals reviewed.    ED Treatments / Results  Labs (all labs ordered are listed, but only abnormal results are displayed) Labs Reviewed - No data to display  EKG None  Radiology No results found.  Procedures Procedures (including critical care time)  Medications Ordered in ED Medications  HYDROcodone-acetaminophen (NORCO/VICODIN) 5-325 MG per tablet 2 tablet (has no administration in time range)  ondansetron (ZOFRAN-ODT) disintegrating tablet 4 mg (4 mg Oral Given 04/05/18  0017)     Initial Impression / Assessment and Plan / ED Course  I have reviewed the triage vital signs and the nursing notes.  Pertinent labs & imaging results that were available during my care of the patient were reviewed by me and considered in my medical decision making (see chart for details).  X-rays show a fracture of the proximal humerus.  She will be placed in a sling immobilizer and follow-up with orthopedics.  Her husband has seen Dr. Lyla Glassing in the past and would like to have her followed up by him.  Final Clinical Impressions(s) / ED Diagnoses   Final diagnoses:  None    ED Discharge Orders    None       Veryl Speak, MD 04/05/18 [id number]

## 2018-04-05 NOTE — ED Notes (Signed)
Patient transported to X-ray 

## 2018-04-09 ENCOUNTER — Other Ambulatory Visit: Payer: Self-pay

## 2018-04-09 ENCOUNTER — Encounter (HOSPITAL_BASED_OUTPATIENT_CLINIC_OR_DEPARTMENT_OTHER): Payer: Self-pay | Admitting: *Deleted

## 2018-04-09 ENCOUNTER — Emergency Department (HOSPITAL_BASED_OUTPATIENT_CLINIC_OR_DEPARTMENT_OTHER): Payer: Medicare Other

## 2018-04-09 ENCOUNTER — Emergency Department (HOSPITAL_BASED_OUTPATIENT_CLINIC_OR_DEPARTMENT_OTHER)
Admission: EM | Admit: 2018-04-09 | Discharge: 2018-04-09 | Disposition: A | Discharge status: Home / Self Care | Payer: Medicare Other | Attending: Emergency Medicine | Admitting: Emergency Medicine

## 2018-04-09 DIAGNOSIS — K5900 Constipation, unspecified: Secondary | ICD-10-CM | POA: Diagnosis not present

## 2018-04-09 DIAGNOSIS — I10 Essential (primary) hypertension: Secondary | ICD-10-CM | POA: Diagnosis not present

## 2018-04-09 DIAGNOSIS — R0781 Pleurodynia: Secondary | ICD-10-CM

## 2018-04-09 DIAGNOSIS — R109 Unspecified abdominal pain: Secondary | ICD-10-CM | POA: Diagnosis not present

## 2018-04-09 DIAGNOSIS — S299XXA Unspecified injury of thorax, initial encounter: Secondary | ICD-10-CM | POA: Diagnosis not present

## 2018-04-09 DIAGNOSIS — S3991XA Unspecified injury of abdomen, initial encounter: Secondary | ICD-10-CM | POA: Diagnosis not present

## 2018-04-09 DIAGNOSIS — K6289 Other specified diseases of anus and rectum: Secondary | ICD-10-CM | POA: Diagnosis not present

## 2018-04-09 DIAGNOSIS — Z79899 Other long term (current) drug therapy: Secondary | ICD-10-CM | POA: Insufficient documentation

## 2018-04-09 MED ORDER — MAGNESIUM CITRATE PO SOLN
1.0000 | Freq: Once | ORAL | Status: AC
  Administered 2018-04-09: 1 via ORAL
  Filled 2018-04-09: qty 296

## 2018-04-09 MED ORDER — FLEET ENEMA 7-19 GM/118ML RE ENEM
1.0000 | ENEMA | Freq: Once | RECTAL | Status: AC
  Administered 2018-04-09: 1 via RECTAL
  Filled 2018-04-09: qty 1

## 2018-04-09 NOTE — ED Notes (Signed)
ED Provider at bedside. 

## 2018-04-09 NOTE — ED Triage Notes (Signed)
Constipation. She was started on pain medication a week ago for arm injury. She used a suppository this am with no relief.

## 2018-04-09 NOTE — Discharge Instructions (Signed)
M

## 2018-04-09 NOTE — ED Notes (Signed)
Pt sitting on commode. She was able to expel a small amount of stool.

## 2018-04-09 NOTE — ED Notes (Signed)
Family at bedside. 

## 2018-04-09 NOTE — ED Notes (Signed)
Large stool in BSC.  Pt. Reports she feels better.  Pt. Cleaned by RN Rosana Hoes and Jenny Reichmann EMT.  Room cleaned by both RN and EMT.  Bed sheets changed.

## 2018-04-10 NOTE — ED Provider Notes (Signed)
[place] EMERGENCY DEPARTMENT Provider Note   CSN: [id number] Arrival date & time: 04/09/18  1401     History   Chief Complaint Chief Complaint  Patient presents with  . Constipation    HPI Lauren Key is a 77 y.o. female presenting for evaluation of constipation.  Patient states that last week she fell, and broke her left arm.  She was placed on narcotic pain medication.  She has been taking Senokot, but has had issues with her bowels ever since she has been taking the narcotic medication.  She states her last bowel movement was 3 days ago.  She reports pain at her rectum, denies abdominal pain.  She is tolerating p.o. without difficulty.  She denies nausea or vomiting.  She has a history of C-sections, no other abdominal surgeries.  She denies fevers, chills, chest pain, shortness of breath, or abnormal urination.  She has been taking Senokot trying to stay well-hydrated, but this is not helping.  She has not tried anything else for constipation.  Patient states she is still passing gas.  HPI  Past Medical History:  Diagnosis Date  . Hypertension     Patient Active Problem List   Diagnosis Date Noted  . Sepsis ([place]) 10/12/2013  . Community acquired pneumonia 10/11/2013  . Hypertension 10/11/2013  . Hypokalemia 10/11/2013    Past Surgical History:  Procedure Laterality Date  . CESAREAN SECTION       OB History   None      Home Medications    Prior to Admission medications   Medication Sig Start Date End Date Taking? Authorizing Provider  albuterol (PROVENTIL HFA;VENTOLIN HFA) 108 (90 BASE) MCG/ACT inhaler Inhale 2 puffs into the lungs every 6 (six) hours as needed for wheezing or shortness of breath. 10/13/13   Barton Dubois, MD  alendronate (FOSAMAX) 70 MG tablet Take 70 mg by mouth once a week. Take with a full glass of water on an empty stomach. Take on fridays or saturdays    [provider]  amLODipine (NORVASC) 5 MG tablet Take 5  mg by mouth daily.    [provider]  atenolol-chlorthalidone (TENORETIC) 100-25 MG per tablet Take 1 tablet by mouth daily.    [provider]  atorvastatin (LIPITOR) 20 MG tablet Take 20 mg by mouth daily.    [provider]  Cholecalciferol (VITAMIN D) 2000 UNITS CAPS Take by mouth.    [provider]  HYDROcodone-acetaminophen (NORCO) 5-325 MG tablet Take 1-2 tablets by mouth every 6 (six) hours as needed. 04/05/18   Veryl Speak, MD  moxifloxacin (AVELOX) 400 MG tablet Take 1 tablet (400 mg total) by mouth daily at 8 pm. 10/13/13   Barton Dubois, MD    Family History No family history on file.  Social History Social History   Tobacco Use  . Smoking status: Never Smoker  . Smokeless tobacco: Never Used  Substance Use Topics  . Alcohol use: No  . Drug use: No     Allergies   Tetanus toxoids   Review of Systems Review of Systems  Gastrointestinal: Positive for constipation.  All other systems reviewed and are negative.    Physical Exam Updated Vital Signs BP (!) 160/72 (BP Location: Right Arm)   Pulse 85   Temp 98.4 F (36.9 C) (Oral)   Resp 18   Ht 5' (1.524 m)   Wt 64.9 kg (143 lb)   SpO2 96%   BMI 27.93 kg/m   Physical  Exam  Constitutional: She is oriented to person, place, and time. She appears well-developed and well-nourished. No distress.  Elderly female in no apparent distress  HENT:  Head: Normocephalic and atraumatic.  Eyes: Pupils are equal, round, and reactive to light. Conjunctivae and EOM are normal.  Neck: Normal range of motion. Neck supple.  Cardiovascular: Normal rate, regular rhythm and intact distal pulses.  Pulmonary/Chest: Effort normal and breath sounds normal. No respiratory distress. She has no wheezes.  Abdominal: Soft. Bowel sounds are normal. She exhibits no distension and no mass. There is no tenderness. There is no rebound and no guarding.  No tenderness palpation of the abdomen.  Soft  without rigidity, guarding, or distention.  No masses.  Musculoskeletal: Normal range of motion.  Neurological: She is alert and oriented to person, place, and time.  Skin: Skin is warm and dry.  Psychiatric: She has a normal mood and affect.  Nursing note and vitals reviewed.    ED Treatments / Results  Labs (all labs ordered are listed, but only abnormal results are displayed) Labs Reviewed - No data to display  EKG None  Radiology Dg Chest 1 View  Result Date: 04/09/2018 CLINICAL DATA:  Left rib pain.  Fall 6 days ago EXAM: CHEST  1 VIEW COMPARISON:  11/21/2016 FINDINGS: Heart size and vascularity normal. Negative for heart failure or pneumonia. Mild pleural thickening on the left is unchanged from the prior study and appears chronic. Fracture left proximal humerus as noted on recent x-ray. No displaced rib fracture on the left. IMPRESSION: No acute cardiopulmonary abnormality Transverse fracture left humeral neck Electronically Signed   By: Franchot Gallo M.D.   On: 04/09/2018 16:08   Dg Abdomen 1 View  Result Date: 04/09/2018 CLINICAL DATA:  Fall.  Left sided pain.  Constipation EXAM: ABDOMEN - 1 VIEW COMPARISON:  12/01/2003 FINDINGS: Nonobstructive bowel gas pattern. Moderate amount of stool in the rectum. Mild amount of stool in the right colon. Negative for urinary tract calculi. Mild lumbar scoliosis and multilevel degenerative change. IMPRESSION: Moderate stool in the rectum. Electronically Signed   By: Franchot Gallo M.D.   On: 04/09/2018 16:09    Procedures Procedures (including critical care time)  Medications Ordered in ED Medications  sodium phosphate (FLEET) 7-19 GM/118ML enema 1 enema (1 enema Rectal Given 04/09/18 1653)  magnesium citrate solution 1 Bottle (1 Bottle Oral Given 04/09/18 1653)     Initial Impression / Assessment and Plan / ED Course  I have reviewed the triage vital signs and the nursing notes.  Pertinent labs & imaging results that were  available during my care of the patient were reviewed by me and considered in my medical decision making (see chart for details).     Pt presenting for evaluation of constipation.  Physical exam reassuring, she is tolerating p.o and passing gas.  No vomiting. Low risk for bowel obstruction. Will obtain dg abd and chest for further evaluation.   xrays viewed and interpreted by me, shows moderate stool burden with increased stool at the rectum.  Will try mag citrate and enema and reassess.  Case discussed with attending, Dr. Sherry Ruffing evaluated the patient.  Patient had large bowel movement, reports symptoms have resolved.  At this time, I do not believe further lab work or imaging is necessary.  Doubt intra-abdominal infection, perforation, obstruction.  Doubt surgical abdomen.  Discussed with pt importance of hydration and use of MiraLAX to encourage daily bowel movements as needed.  Discussed follow-up with primary  care for further evaluation.  At this time, patient appears safe for discharge.  Return precautions given.  Patient states she understands and agrees plan.  Final Clinical Impressions(s) / ED Diagnoses   Final diagnoses:  Constipation, unspecified constipation type    ED Discharge Orders    None       Franchot Heidelberg, PA-C 04/10/18 0025    Tegeler, Gwenyth Allegra, MD 04/10/18 1025

## 2018-04-17 DIAGNOSIS — M25512 Pain in left shoulder: Secondary | ICD-10-CM | POA: Diagnosis not present

## 2018-04-17 DIAGNOSIS — S42252A Displaced fracture of greater tuberosity of left humerus, initial encounter for closed fracture: Secondary | ICD-10-CM | POA: Diagnosis not present

## 2018-04-27 DIAGNOSIS — S42252D Displaced fracture of greater tuberosity of left humerus, subsequent encounter for fracture with routine healing: Secondary | ICD-10-CM | POA: Diagnosis not present

## 2018-05-07 DIAGNOSIS — M25512 Pain in left shoulder: Secondary | ICD-10-CM | POA: Diagnosis not present

## 2018-05-07 DIAGNOSIS — S42302A Unspecified fracture of shaft of humerus, left arm, initial encounter for closed fracture: Secondary | ICD-10-CM | POA: Diagnosis not present

## 2018-05-11 DIAGNOSIS — S42302A Unspecified fracture of shaft of humerus, left arm, initial encounter for closed fracture: Secondary | ICD-10-CM | POA: Diagnosis not present

## 2018-05-11 DIAGNOSIS — M25512 Pain in left shoulder: Secondary | ICD-10-CM | POA: Diagnosis not present

## 2018-05-14 DIAGNOSIS — S42302A Unspecified fracture of shaft of humerus, left arm, initial encounter for closed fracture: Secondary | ICD-10-CM | POA: Diagnosis not present

## 2018-05-14 DIAGNOSIS — M25512 Pain in left shoulder: Secondary | ICD-10-CM | POA: Diagnosis not present

## 2018-05-19 DIAGNOSIS — M25512 Pain in left shoulder: Secondary | ICD-10-CM | POA: Diagnosis not present

## 2018-05-19 DIAGNOSIS — S42302A Unspecified fracture of shaft of humerus, left arm, initial encounter for closed fracture: Secondary | ICD-10-CM | POA: Diagnosis not present

## 2018-05-21 DIAGNOSIS — S42302A Unspecified fracture of shaft of humerus, left arm, initial encounter for closed fracture: Secondary | ICD-10-CM | POA: Diagnosis not present

## 2018-05-21 DIAGNOSIS — M25512 Pain in left shoulder: Secondary | ICD-10-CM | POA: Diagnosis not present

## 2018-05-26 DIAGNOSIS — M25512 Pain in left shoulder: Secondary | ICD-10-CM | POA: Diagnosis not present

## 2018-05-26 DIAGNOSIS — S42302A Unspecified fracture of shaft of humerus, left arm, initial encounter for closed fracture: Secondary | ICD-10-CM | POA: Diagnosis not present

## 2018-05-28 DIAGNOSIS — M25512 Pain in left shoulder: Secondary | ICD-10-CM | POA: Diagnosis not present

## 2018-05-28 DIAGNOSIS — S42302A Unspecified fracture of shaft of humerus, left arm, initial encounter for closed fracture: Secondary | ICD-10-CM | POA: Diagnosis not present

## 2018-06-01 DIAGNOSIS — S42252D Displaced fracture of greater tuberosity of left humerus, subsequent encounter for fracture with routine healing: Secondary | ICD-10-CM | POA: Diagnosis not present

## 2018-06-02 DIAGNOSIS — M25512 Pain in left shoulder: Secondary | ICD-10-CM | POA: Diagnosis not present

## 2018-06-02 DIAGNOSIS — S42302A Unspecified fracture of shaft of humerus, left arm, initial encounter for closed fracture: Secondary | ICD-10-CM | POA: Diagnosis not present

## 2018-06-04 DIAGNOSIS — M25512 Pain in left shoulder: Secondary | ICD-10-CM | POA: Diagnosis not present

## 2018-06-04 DIAGNOSIS — S42302A Unspecified fracture of shaft of humerus, left arm, initial encounter for closed fracture: Secondary | ICD-10-CM | POA: Diagnosis not present

## 2018-06-11 DIAGNOSIS — M25512 Pain in left shoulder: Secondary | ICD-10-CM | POA: Diagnosis not present

## 2018-06-11 DIAGNOSIS — S42302A Unspecified fracture of shaft of humerus, left arm, initial encounter for closed fracture: Secondary | ICD-10-CM | POA: Diagnosis not present

## 2018-06-16 DIAGNOSIS — M25512 Pain in left shoulder: Secondary | ICD-10-CM | POA: Diagnosis not present

## 2018-06-16 DIAGNOSIS — S42302A Unspecified fracture of shaft of humerus, left arm, initial encounter for closed fracture: Secondary | ICD-10-CM | POA: Diagnosis not present

## 2018-06-18 DIAGNOSIS — M25512 Pain in left shoulder: Secondary | ICD-10-CM | POA: Diagnosis not present

## 2018-06-18 DIAGNOSIS — S42302A Unspecified fracture of shaft of humerus, left arm, initial encounter for closed fracture: Secondary | ICD-10-CM | POA: Diagnosis not present

## 2018-06-23 DIAGNOSIS — M25512 Pain in left shoulder: Secondary | ICD-10-CM | POA: Diagnosis not present

## 2018-06-23 DIAGNOSIS — S42302A Unspecified fracture of shaft of humerus, left arm, initial encounter for closed fracture: Secondary | ICD-10-CM | POA: Diagnosis not present

## 2018-06-25 DIAGNOSIS — S42302A Unspecified fracture of shaft of humerus, left arm, initial encounter for closed fracture: Secondary | ICD-10-CM | POA: Diagnosis not present

## 2018-06-25 DIAGNOSIS — M25512 Pain in left shoulder: Secondary | ICD-10-CM | POA: Diagnosis not present

## 2018-06-30 DIAGNOSIS — S42302A Unspecified fracture of shaft of humerus, left arm, initial encounter for closed fracture: Secondary | ICD-10-CM | POA: Diagnosis not present

## 2018-06-30 DIAGNOSIS — M25512 Pain in left shoulder: Secondary | ICD-10-CM | POA: Diagnosis not present

## 2018-07-02 DIAGNOSIS — S42302A Unspecified fracture of shaft of humerus, left arm, initial encounter for closed fracture: Secondary | ICD-10-CM | POA: Diagnosis not present

## 2018-07-02 DIAGNOSIS — M25512 Pain in left shoulder: Secondary | ICD-10-CM | POA: Diagnosis not present

## 2018-07-06 DIAGNOSIS — J309 Allergic rhinitis, unspecified: Secondary | ICD-10-CM | POA: Diagnosis not present

## 2018-07-06 DIAGNOSIS — Z1389 Encounter for screening for other disorder: Secondary | ICD-10-CM | POA: Diagnosis not present

## 2018-07-06 DIAGNOSIS — I10 Essential (primary) hypertension: Secondary | ICD-10-CM | POA: Diagnosis not present

## 2018-07-06 DIAGNOSIS — E782 Mixed hyperlipidemia: Secondary | ICD-10-CM | POA: Diagnosis not present

## 2018-07-06 DIAGNOSIS — R7303 Prediabetes: Secondary | ICD-10-CM | POA: Diagnosis not present

## 2018-07-06 DIAGNOSIS — R251 Tremor, unspecified: Secondary | ICD-10-CM | POA: Diagnosis not present

## 2018-07-06 DIAGNOSIS — E559 Vitamin D deficiency, unspecified: Secondary | ICD-10-CM | POA: Diagnosis not present

## 2018-07-06 DIAGNOSIS — M85852 Other specified disorders of bone density and structure, left thigh: Secondary | ICD-10-CM | POA: Diagnosis not present

## 2018-07-06 DIAGNOSIS — Z0000 Encounter for general adult medical examination without abnormal findings: Secondary | ICD-10-CM | POA: Diagnosis not present

## 2018-07-06 DIAGNOSIS — R3915 Urgency of urination: Secondary | ICD-10-CM | POA: Diagnosis not present

## 2018-07-06 DIAGNOSIS — Z8781 Personal history of (healed) traumatic fracture: Secondary | ICD-10-CM | POA: Diagnosis not present

## 2018-07-07 DIAGNOSIS — S42302A Unspecified fracture of shaft of humerus, left arm, initial encounter for closed fracture: Secondary | ICD-10-CM | POA: Diagnosis not present

## 2018-07-07 DIAGNOSIS — M25512 Pain in left shoulder: Secondary | ICD-10-CM | POA: Diagnosis not present

## 2018-07-09 DIAGNOSIS — S42302A Unspecified fracture of shaft of humerus, left arm, initial encounter for closed fracture: Secondary | ICD-10-CM | POA: Diagnosis not present

## 2018-07-09 DIAGNOSIS — M25512 Pain in left shoulder: Secondary | ICD-10-CM | POA: Diagnosis not present

## 2018-07-14 DIAGNOSIS — M25512 Pain in left shoulder: Secondary | ICD-10-CM | POA: Diagnosis not present

## 2018-07-14 DIAGNOSIS — S42302A Unspecified fracture of shaft of humerus, left arm, initial encounter for closed fracture: Secondary | ICD-10-CM | POA: Diagnosis not present

## 2018-07-16 DIAGNOSIS — S42302A Unspecified fracture of shaft of humerus, left arm, initial encounter for closed fracture: Secondary | ICD-10-CM | POA: Diagnosis not present

## 2018-07-16 DIAGNOSIS — M25512 Pain in left shoulder: Secondary | ICD-10-CM | POA: Diagnosis not present

## 2018-07-21 DIAGNOSIS — M25512 Pain in left shoulder: Secondary | ICD-10-CM | POA: Diagnosis not present

## 2018-07-21 DIAGNOSIS — S42302A Unspecified fracture of shaft of humerus, left arm, initial encounter for closed fracture: Secondary | ICD-10-CM | POA: Diagnosis not present

## 2018-07-23 DIAGNOSIS — S42302A Unspecified fracture of shaft of humerus, left arm, initial encounter for closed fracture: Secondary | ICD-10-CM | POA: Diagnosis not present

## 2018-07-23 DIAGNOSIS — M25512 Pain in left shoulder: Secondary | ICD-10-CM | POA: Diagnosis not present

## 2018-07-28 DIAGNOSIS — M25512 Pain in left shoulder: Secondary | ICD-10-CM | POA: Diagnosis not present

## 2018-07-28 DIAGNOSIS — S42302A Unspecified fracture of shaft of humerus, left arm, initial encounter for closed fracture: Secondary | ICD-10-CM | POA: Diagnosis not present

## 2018-07-30 DIAGNOSIS — M25512 Pain in left shoulder: Secondary | ICD-10-CM | POA: Diagnosis not present

## 2018-07-30 DIAGNOSIS — S42302A Unspecified fracture of shaft of humerus, left arm, initial encounter for closed fracture: Secondary | ICD-10-CM | POA: Diagnosis not present

## 2018-08-03 DIAGNOSIS — S42252D Displaced fracture of greater tuberosity of left humerus, subsequent encounter for fracture with routine healing: Secondary | ICD-10-CM | POA: Diagnosis not present

## 2018-08-04 DIAGNOSIS — M25512 Pain in left shoulder: Secondary | ICD-10-CM | POA: Diagnosis not present

## 2018-08-04 DIAGNOSIS — S42302A Unspecified fracture of shaft of humerus, left arm, initial encounter for closed fracture: Secondary | ICD-10-CM | POA: Diagnosis not present

## 2018-08-06 DIAGNOSIS — S42302A Unspecified fracture of shaft of humerus, left arm, initial encounter for closed fracture: Secondary | ICD-10-CM | POA: Diagnosis not present

## 2018-08-06 DIAGNOSIS — M25512 Pain in left shoulder: Secondary | ICD-10-CM | POA: Diagnosis not present

## 2018-08-11 DIAGNOSIS — M25512 Pain in left shoulder: Secondary | ICD-10-CM | POA: Diagnosis not present

## 2018-08-11 DIAGNOSIS — S42302A Unspecified fracture of shaft of humerus, left arm, initial encounter for closed fracture: Secondary | ICD-10-CM | POA: Diagnosis not present

## 2018-08-14 DIAGNOSIS — S42302A Unspecified fracture of shaft of humerus, left arm, initial encounter for closed fracture: Secondary | ICD-10-CM | POA: Diagnosis not present

## 2018-08-14 DIAGNOSIS — M25512 Pain in left shoulder: Secondary | ICD-10-CM | POA: Diagnosis not present

## 2018-08-18 DIAGNOSIS — S42302A Unspecified fracture of shaft of humerus, left arm, initial encounter for closed fracture: Secondary | ICD-10-CM | POA: Diagnosis not present

## 2018-08-18 DIAGNOSIS — M25512 Pain in left shoulder: Secondary | ICD-10-CM | POA: Diagnosis not present

## 2018-08-20 DIAGNOSIS — M25512 Pain in left shoulder: Secondary | ICD-10-CM | POA: Diagnosis not present

## 2018-08-20 DIAGNOSIS — S42302A Unspecified fracture of shaft of humerus, left arm, initial encounter for closed fracture: Secondary | ICD-10-CM | POA: Diagnosis not present

## 2018-08-25 DIAGNOSIS — S42302A Unspecified fracture of shaft of humerus, left arm, initial encounter for closed fracture: Secondary | ICD-10-CM | POA: Diagnosis not present

## 2018-08-25 DIAGNOSIS — M25512 Pain in left shoulder: Secondary | ICD-10-CM | POA: Diagnosis not present

## 2018-08-27 DIAGNOSIS — S42302A Unspecified fracture of shaft of humerus, left arm, initial encounter for closed fracture: Secondary | ICD-10-CM | POA: Diagnosis not present

## 2018-08-27 DIAGNOSIS — M25512 Pain in left shoulder: Secondary | ICD-10-CM | POA: Diagnosis not present

## 2018-09-01 DIAGNOSIS — M25512 Pain in left shoulder: Secondary | ICD-10-CM | POA: Diagnosis not present

## 2018-09-01 DIAGNOSIS — S42302A Unspecified fracture of shaft of humerus, left arm, initial encounter for closed fracture: Secondary | ICD-10-CM | POA: Diagnosis not present

## 2018-09-03 DIAGNOSIS — S42302A Unspecified fracture of shaft of humerus, left arm, initial encounter for closed fracture: Secondary | ICD-10-CM | POA: Diagnosis not present

## 2018-09-03 DIAGNOSIS — M25512 Pain in left shoulder: Secondary | ICD-10-CM | POA: Diagnosis not present

## 2018-10-05 DIAGNOSIS — S42252D Displaced fracture of greater tuberosity of left humerus, subsequent encounter for fracture with routine healing: Secondary | ICD-10-CM | POA: Diagnosis not present

## 2019-01-05 DIAGNOSIS — E559 Vitamin D deficiency, unspecified: Secondary | ICD-10-CM | POA: Diagnosis not present

## 2019-01-05 DIAGNOSIS — M109 Gout, unspecified: Secondary | ICD-10-CM | POA: Diagnosis not present

## 2019-01-05 DIAGNOSIS — Z8781 Personal history of (healed) traumatic fracture: Secondary | ICD-10-CM | POA: Diagnosis not present

## 2019-01-05 DIAGNOSIS — R251 Tremor, unspecified: Secondary | ICD-10-CM | POA: Diagnosis not present

## 2019-01-05 DIAGNOSIS — I10 Essential (primary) hypertension: Secondary | ICD-10-CM | POA: Diagnosis not present

## 2019-01-05 DIAGNOSIS — E782 Mixed hyperlipidemia: Secondary | ICD-10-CM | POA: Diagnosis not present

## 2019-01-05 DIAGNOSIS — M85852 Other specified disorders of bone density and structure, left thigh: Secondary | ICD-10-CM | POA: Diagnosis not present

## 2019-01-05 DIAGNOSIS — J309 Allergic rhinitis, unspecified: Secondary | ICD-10-CM | POA: Diagnosis not present

## 2019-01-05 DIAGNOSIS — R7303 Prediabetes: Secondary | ICD-10-CM | POA: Diagnosis not present

## 2019-06-16 ENCOUNTER — Other Ambulatory Visit: Payer: Self-pay

## 2019-06-16 DIAGNOSIS — Z20822 Contact with and (suspected) exposure to covid-19: Secondary | ICD-10-CM

## 2019-06-17 LAB — NOVEL CORONAVIRUS, NAA: SARS-CoV-2, NAA: DETECTED — AB

## 2019-07-19 DIAGNOSIS — R7303 Prediabetes: Secondary | ICD-10-CM | POA: Diagnosis not present

## 2019-07-19 DIAGNOSIS — Z8781 Personal history of (healed) traumatic fracture: Secondary | ICD-10-CM | POA: Diagnosis not present

## 2019-07-19 DIAGNOSIS — I10 Essential (primary) hypertension: Secondary | ICD-10-CM | POA: Diagnosis not present

## 2019-07-19 DIAGNOSIS — E559 Vitamin D deficiency, unspecified: Secondary | ICD-10-CM | POA: Diagnosis not present

## 2019-07-19 DIAGNOSIS — R251 Tremor, unspecified: Secondary | ICD-10-CM | POA: Diagnosis not present

## 2019-07-19 DIAGNOSIS — Z1211 Encounter for screening for malignant neoplasm of colon: Secondary | ICD-10-CM | POA: Diagnosis not present

## 2019-07-19 DIAGNOSIS — J309 Allergic rhinitis, unspecified: Secondary | ICD-10-CM | POA: Diagnosis not present

## 2019-07-19 DIAGNOSIS — E782 Mixed hyperlipidemia: Secondary | ICD-10-CM | POA: Diagnosis not present

## 2019-07-19 DIAGNOSIS — Z0000 Encounter for general adult medical examination without abnormal findings: Secondary | ICD-10-CM | POA: Diagnosis not present

## 2019-07-19 DIAGNOSIS — M109 Gout, unspecified: Secondary | ICD-10-CM | POA: Diagnosis not present

## 2019-07-19 DIAGNOSIS — Z8619 Personal history of other infectious and parasitic diseases: Secondary | ICD-10-CM | POA: Diagnosis not present

## 2019-07-19 DIAGNOSIS — M85852 Other specified disorders of bone density and structure, left thigh: Secondary | ICD-10-CM | POA: Diagnosis not present

## 2019-08-03 DIAGNOSIS — E559 Vitamin D deficiency, unspecified: Secondary | ICD-10-CM | POA: Diagnosis not present

## 2019-08-03 DIAGNOSIS — R7303 Prediabetes: Secondary | ICD-10-CM | POA: Diagnosis not present

## 2019-08-03 DIAGNOSIS — Z23 Encounter for immunization: Secondary | ICD-10-CM | POA: Diagnosis not present

## 2019-08-03 DIAGNOSIS — E782 Mixed hyperlipidemia: Secondary | ICD-10-CM | POA: Diagnosis not present

## 2019-08-03 DIAGNOSIS — M109 Gout, unspecified: Secondary | ICD-10-CM | POA: Diagnosis not present

## 2020-01-28 DIAGNOSIS — E782 Mixed hyperlipidemia: Secondary | ICD-10-CM | POA: Diagnosis not present

## 2020-01-28 DIAGNOSIS — M8580 Other specified disorders of bone density and structure, unspecified site: Secondary | ICD-10-CM | POA: Diagnosis not present

## 2020-01-28 DIAGNOSIS — I10 Essential (primary) hypertension: Secondary | ICD-10-CM | POA: Diagnosis not present

## 2020-02-03 DIAGNOSIS — R251 Tremor, unspecified: Secondary | ICD-10-CM | POA: Diagnosis not present

## 2020-02-03 DIAGNOSIS — R0789 Other chest pain: Secondary | ICD-10-CM | POA: Diagnosis not present

## 2020-02-03 DIAGNOSIS — M85852 Other specified disorders of bone density and structure, left thigh: Secondary | ICD-10-CM | POA: Diagnosis not present

## 2020-02-03 DIAGNOSIS — M545 Low back pain: Secondary | ICD-10-CM | POA: Diagnosis not present

## 2020-02-03 DIAGNOSIS — E559 Vitamin D deficiency, unspecified: Secondary | ICD-10-CM | POA: Diagnosis not present

## 2020-02-03 DIAGNOSIS — J309 Allergic rhinitis, unspecified: Secondary | ICD-10-CM | POA: Diagnosis not present

## 2020-02-03 DIAGNOSIS — M109 Gout, unspecified: Secondary | ICD-10-CM | POA: Diagnosis not present

## 2020-02-03 DIAGNOSIS — E782 Mixed hyperlipidemia: Secondary | ICD-10-CM | POA: Diagnosis not present

## 2020-02-03 DIAGNOSIS — I10 Essential (primary) hypertension: Secondary | ICD-10-CM | POA: Diagnosis not present

## 2020-02-03 DIAGNOSIS — Z8619 Personal history of other infectious and parasitic diseases: Secondary | ICD-10-CM | POA: Diagnosis not present

## 2020-02-03 DIAGNOSIS — Z8781 Personal history of (healed) traumatic fracture: Secondary | ICD-10-CM | POA: Diagnosis not present

## 2020-02-03 DIAGNOSIS — R7303 Prediabetes: Secondary | ICD-10-CM | POA: Diagnosis not present

## 2020-02-09 ENCOUNTER — Other Ambulatory Visit: Payer: Self-pay | Admitting: Family Medicine

## 2020-02-09 DIAGNOSIS — M8580 Other specified disorders of bone density and structure, unspecified site: Secondary | ICD-10-CM

## 2020-02-11 DIAGNOSIS — R7989 Other specified abnormal findings of blood chemistry: Secondary | ICD-10-CM | POA: Diagnosis not present

## 2020-03-24 ENCOUNTER — Other Ambulatory Visit: Payer: Self-pay | Admitting: Family Medicine

## 2020-03-24 DIAGNOSIS — Z1231 Encounter for screening mammogram for malignant neoplasm of breast: Secondary | ICD-10-CM

## 2020-04-03 NOTE — Progress Notes (Signed)
Cardiology Office Note:    Date:  04/04/2020   ID:  Lauren Key [place], DOB Aug 26, 1941, MRN [id number]  PCP:  Antony Contras, MD  Cardiologist:  No primary care provider on file.   Referring MD: Antony Contras, MD   Chief Complaint  Patient presents with  . Advice Only    Grabbing sensation in chest  . Heart Murmur    History of Present Illness:    Lauren Key is a 79 y.o. female with a hx of hypertension referred for consideration of mixed hyperlipidemia.  She is referred by Dr. Moreen Fowler because of a cramping feeling in her chest that occurs nocturnally and awakens her from sleep.  Since April Lauren Key is noted she awakens 2 or 3 times per week with a sensation in her chest that she characterizes as a "grabbing".  When she awakens within seconds the sensation has resolved and she feels okay.  It is never occurred with physical activity.  It is not associated with dyspnea or which he would consider to be palpitations.  There is no associated shortness of breath or other complaints.  She had a murmur during pregnancy but no one is mentioned that since that time.  Past Medical History:  Diagnosis Date  . Hypertension     Past Surgical History:  Procedure Laterality Date  . CESAREAN SECTION      Current Medications: Current Meds  Medication Sig  . alendronate (FOSAMAX) 70 MG tablet Take 70 mg by mouth once a week. Take with a full glass of water on an empty stomach. Take on fridays or saturdays  . amLODipine (NORVASC) 5 MG tablet Take 5 mg by mouth daily.  Marland Kitchen atenolol-chlorthalidone (TENORETIC) 100-25 MG per tablet Take 1 tablet by mouth daily.  Marland Kitchen atorvastatin (LIPITOR) 20 MG tablet Take 20 mg by mouth daily.  . primidone (MYSOLINE) 50 MG tablet Take 50 mg by mouth daily.     Allergies:   Tetanus toxoids   Social History   Socioeconomic History  . Marital status: Married    Spouse name: Not on file  . Number of children: Not on file  . Years of education: Not on file   . Highest education level: Not on file  Occupational History  . Not on file  Tobacco Use  . Smoking status: Never Smoker  . Smokeless tobacco: Never Used  Vaping Use  . Vaping Use: Never used  Substance and Sexual Activity  . Alcohol use: No  . Drug use: No  . Sexual activity: Not Currently  Other Topics Concern  . Not on file  Social History Narrative  . Not on file   Social Determinants of Health   Financial Resource Strain:   . Difficulty of Paying Living Expenses:   Food Insecurity:   . Worried About Charity fundraiser in the Last Year:   . Arboriculturist in the Last Year:   Transportation Needs:   . Film/video editor (Medical):   Marland Kitchen Lack of Transportation (Non-Medical):   Physical Activity:   . Days of Exercise per Week:   . Minutes of Exercise per Session:   Stress:   . Feeling of Stress :   Social Connections:   . Frequency of Communication with Friends and Family:   . Frequency of Social Gatherings with Friends and Family:   . Attends Religious Services:   . Active Member of Clubs or Organizations:   . Attends Archivist Meetings:   .  Marital Status:      Family History: The patient's family history is not on file.  ROS:   Please see the history of present illness.    She had COVID-19 in November.  Her mother who was 100 died of COVID-43.  She has been vaccinated.  Occasional dizziness.  She does not sleep well.   All other systems reviewed and are negative.  EKGs/Labs/Other Studies Reviewed:    The following studies were reviewed today: No cardiac evaluation is ever been done  EKG:  EKG sinus rhythm, left anterior hemiblock, poor R wave progression and possible left ventricular hypertrophy  Recent Labs: No results found for requested labs within last 8760 hours.  Recent Lipid Panel No results found for: CHOL, TRIG, HDL, CHOLHDL, VLDL, LDLCALC, LDLDIRECT  Physical Exam:    VS:  BP 110/70   Pulse 65   Ht 4\' 11"  (1.499 m)   Wt  150 lb (68 kg)   SpO2 96%   BMI 30.30 kg/m     Wt Readings from Last 3 Encounters:  04/04/20 150 lb (68 kg)  04/09/18 143 lb (64.9 kg)  10/13/13 143 lb 8.3 oz (65.1 kg)     GEN: Elderly. No acute distress HEENT: Normal NECK: No JVD. LYMPHATICS: No lymphadenopathy CARDIAC: 2-3 over 6 right upper sternal systolic crescendo decrescendo murmur.  RRR without diastolic murmur, gallop, or edema. VASCULAR: Bilateral carotid bruit felt transmitted from aortic valve.  Normal Pulses.  RESPIRATORY:  Clear to auscultation without rales, wheezing or rhonchi  ABDOMEN: Soft, non-tender, non-distended, No pulsatile mass, MUSCULOSKELETAL: No deformity  SKIN: Warm and dry NEUROLOGIC:  Alert and oriented x 3 PSYCHIATRIC:  Normal affect   ASSESSMENT:    1. Palpitations   2. Systolic murmur   3. Mixed hyperlipidemia   4. Prediabetes   5. Hypokalemia   6. Essential hypertension   7. Educated about COVID-19 virus infection    PLAN:    In order of problems listed above:  1. 14-day monitor to exclude arrhythmia.  The "grab" is so short in duration that I do not think this represents an ischemic symptom.  Especially in absence of exertion related complaints. 2. The murmur represents aortic stenosis.  Echo is needed to grade. 3. With prediabetes LDL target is less than 70. 4. Target hemoglobin A1c less than 7.  May have further requests/recommendations depending upon findings of work-up. 5. No evidence of hypokalemia based on today's encounter. 6. Target blood pressure 130/80 mmHg or less.  Low-sodium diet recommended. 7. She has undergone Covid vaccine and is practicing social distancing.  She will return in 6 to 8 weeks after monitor and echocardiogram results are known.  May need an ischemic evaluation depending upon findings.   Medication Adjustments/Labs and Tests Ordered: Current medicines are reviewed at length with the patient today.  Concerns regarding medicines are outlined above.    Orders Placed This Encounter  Procedures  . LONG TERM MONITOR (3-14 DAYS)  . ECHOCARDIOGRAM COMPLETE   No orders of the defined types were placed in this encounter.   Patient Instructions  Medication Instructions:  Your physician recommends that you continue on your current medications as directed. Please refer to the Current Medication list given to you today.  *If you need a refill on your cardiac medications before your next appointment, please call your pharmacy*   Lab Work: None If you have labs (blood work) drawn today and your tests are completely normal, you will receive your results only by: Marland Kitchen  MyChart Message (if you have MyChart) OR . A paper copy in the mail If you have any lab test that is abnormal or we need to change your treatment, we will call you to review the results.   Testing/Procedures: Your physician has requested that you have an echocardiogram. Echocardiography is a painless test that uses sound waves to create images of your heart. It provides your doctor with information about the size and shape of your heart and how well your heart's chambers and valves are working. This procedure takes approximately one hour. There are no restrictions for this procedure.  Your physician recommends that you have a monitor for 14 days   Follow-Up: At [facility], you and your health needs are our priority.  As part of our continuing mission to provide you with exceptional heart care, we have created designated Provider Care Teams.  These Care Teams include your primary Cardiologist (physician) and Advanced Practice Providers (APPs -  Physician Assistants and Nurse Practitioners) who all work together to provide you with the care you need, when you need it.  We recommend signing up for the patient portal called "MyChart".  Sign up information is provided on this After Visit Summary.  MyChart is used to connect with patients for Virtual Visits (Telemedicine).  Patients are  able to view lab/test results, encounter notes, upcoming appointments, etc.  Non-urgent messages can be sent to your provider as well.   To learn more about what you can do with MyChart, go to [email or web address].    Your next appointment:   3 month(s)  The format for your next appointment:   In Person  Provider:   You may see Dr Daneen Schick or one of the following Advanced Practice Providers on your designated Care Team:    Truitt Merle, NP  Cecilie Kicks, NP  Kathyrn Drown, NP       Signed, Sinclair Grooms, MD  04/04/2020 3:23 PM    [place]

## 2020-04-04 ENCOUNTER — Ambulatory Visit (INDEPENDENT_AMBULATORY_CARE_PROVIDER_SITE_OTHER): Payer: Medicare Other | Admitting: Interventional Cardiology

## 2020-04-04 ENCOUNTER — Other Ambulatory Visit: Payer: Self-pay

## 2020-04-04 ENCOUNTER — Encounter: Payer: Self-pay | Admitting: *Deleted

## 2020-04-04 ENCOUNTER — Encounter: Payer: Self-pay | Admitting: Interventional Cardiology

## 2020-04-04 VITALS — BP 110/70 | HR 65 | Ht 59.0 in | Wt 150.0 lb

## 2020-04-04 DIAGNOSIS — R011 Cardiac murmur, unspecified: Secondary | ICD-10-CM

## 2020-04-04 DIAGNOSIS — E782 Mixed hyperlipidemia: Secondary | ICD-10-CM | POA: Diagnosis not present

## 2020-04-04 DIAGNOSIS — R002 Palpitations: Secondary | ICD-10-CM

## 2020-04-04 DIAGNOSIS — E876 Hypokalemia: Secondary | ICD-10-CM | POA: Diagnosis not present

## 2020-04-04 DIAGNOSIS — I10 Essential (primary) hypertension: Secondary | ICD-10-CM | POA: Diagnosis not present

## 2020-04-04 DIAGNOSIS — Z7189 Other specified counseling: Secondary | ICD-10-CM | POA: Diagnosis not present

## 2020-04-04 DIAGNOSIS — R7303 Prediabetes: Secondary | ICD-10-CM | POA: Diagnosis not present

## 2020-04-04 NOTE — Patient Instructions (Signed)
Medication Instructions:  Your physician recommends that you continue on your current medications as directed. Please refer to the Current Medication list given to you today.  *If you need a refill on your cardiac medications before your next appointment, please call your pharmacy*   Lab Work: None If you have labs (blood work) drawn today and your tests are completely normal, you will receive your results only by: Marland Kitchen MyChart Message (if you have MyChart) OR . A paper copy in the mail If you have any lab test that is abnormal or we need to change your treatment, we will call you to review the results.   Testing/Procedures: Your physician has requested that you have an echocardiogram. Echocardiography is a painless test that uses sound waves to create images of your heart. It provides your doctor with information about the size and shape of your heart and how well your heart's chambers and valves are working. This procedure takes approximately one hour. There are no restrictions for this procedure.  Your physician recommends that you have a monitor for 14 days   Follow-Up: At [facility], you and your health needs are our priority.  As part of our continuing mission to provide you with exceptional heart care, we have created designated Provider Care Teams.  These Care Teams include your primary Cardiologist (physician) and Advanced Practice Providers (APPs -  Physician Assistants and Nurse Practitioners) who all work together to provide you with the care you need, when you need it.  We recommend signing up for the patient portal called "MyChart".  Sign up information is provided on this After Visit Summary.  MyChart is used to connect with patients for Virtual Visits (Telemedicine).  Patients are able to view lab/test results, encounter notes, upcoming appointments, etc.  Non-urgent messages can be sent to your provider as well.   To learn more about what you can do with MyChart, go to  [email or web address].    Your next appointment:   3 month(s)  The format for your next appointment:   In Person  Provider:   You may see Dr Daneen Schick or one of the following Advanced Practice Providers on your designated Care Team:    Truitt Merle, NP  Cecilie Kicks, NP  Kathyrn Drown, NP

## 2020-04-06 DIAGNOSIS — H43811 Vitreous degeneration, right eye: Secondary | ICD-10-CM | POA: Diagnosis not present

## 2020-04-06 DIAGNOSIS — Z961 Presence of intraocular lens: Secondary | ICD-10-CM | POA: Diagnosis not present

## 2020-04-06 DIAGNOSIS — H52203 Unspecified astigmatism, bilateral: Secondary | ICD-10-CM | POA: Diagnosis not present

## 2020-04-20 ENCOUNTER — Ambulatory Visit
Admission: RE | Admit: 2020-04-20 | Discharge: 2020-04-20 | Disposition: A | Discharge status: Home / Self Care | Payer: Medicare Other | Source: Ambulatory Visit | Attending: Family Medicine | Admitting: Family Medicine

## 2020-04-20 ENCOUNTER — Other Ambulatory Visit: Payer: Self-pay

## 2020-04-20 DIAGNOSIS — Z1231 Encounter for screening mammogram for malignant neoplasm of breast: Secondary | ICD-10-CM

## 2020-04-21 ENCOUNTER — Ambulatory Visit (HOSPITAL_COMMUNITY): Payer: Medicare Other | Attending: Cardiovascular Disease

## 2020-04-21 ENCOUNTER — Other Ambulatory Visit: Payer: Self-pay

## 2020-04-21 DIAGNOSIS — R011 Cardiac murmur, unspecified: Secondary | ICD-10-CM | POA: Diagnosis not present

## 2020-04-21 LAB — ECHOCARDIOGRAM COMPLETE
Area-P 1/2: 3.17 cm2
S' Lateral: 2.5 cm

## 2020-04-26 ENCOUNTER — Ambulatory Visit (INDEPENDENT_AMBULATORY_CARE_PROVIDER_SITE_OTHER): Payer: Medicare Other

## 2020-04-26 DIAGNOSIS — R002 Palpitations: Secondary | ICD-10-CM

## 2020-05-12 ENCOUNTER — Other Ambulatory Visit: Payer: Self-pay

## 2020-05-12 ENCOUNTER — Ambulatory Visit
Admission: RE | Admit: 2020-05-12 | Discharge: 2020-05-12 | Disposition: A | Discharge status: Home / Self Care | Payer: Medicare Other | Source: Ambulatory Visit | Attending: Family Medicine | Admitting: Family Medicine

## 2020-05-12 DIAGNOSIS — M8580 Other specified disorders of bone density and structure, unspecified site: Secondary | ICD-10-CM

## 2020-05-12 DIAGNOSIS — M85851 Other specified disorders of bone density and structure, right thigh: Secondary | ICD-10-CM | POA: Diagnosis not present

## 2020-05-12 DIAGNOSIS — Z780 Asymptomatic menopausal state: Secondary | ICD-10-CM | POA: Diagnosis not present

## 2020-05-18 DIAGNOSIS — R002 Palpitations: Secondary | ICD-10-CM | POA: Diagnosis not present

## 2020-06-05 DIAGNOSIS — R509 Fever, unspecified: Secondary | ICD-10-CM | POA: Diagnosis not present

## 2020-06-05 DIAGNOSIS — J40 Bronchitis, not specified as acute or chronic: Secondary | ICD-10-CM | POA: Diagnosis not present

## 2020-06-15 DIAGNOSIS — M8580 Other specified disorders of bone density and structure, unspecified site: Secondary | ICD-10-CM | POA: Diagnosis not present

## 2020-06-15 DIAGNOSIS — I10 Essential (primary) hypertension: Secondary | ICD-10-CM | POA: Diagnosis not present

## 2020-06-15 DIAGNOSIS — E782 Mixed hyperlipidemia: Secondary | ICD-10-CM | POA: Diagnosis not present

## 2020-07-09 NOTE — Progress Notes (Signed)
Cardiology Office Note:    Date:  07/12/2020   ID:  Lauren Key [place], DOB 09-13-1941, MRN [id number]  PCP:  Antony Contras, MD  Cardiologist:  Sinclair Grooms, MD   Referring MD: Antony Contras, MD   Chief Complaint  Patient presents with  . Irregular Heart Beat    History of Present Illness:    Lauren Key is a 79 y.o. female with a hx of  hypertension, mixed hyperlipidemia, palpitations, and systolic murmur (Structurally normal heart).    No cardiac complaints.  No medication side effects.  Palpitations have resolved.  We discussed her systolic murmur but normal echo results and hyperdynamic LV function.  Past Medical History:  Diagnosis Date  . Hypertension     Past Surgical History:  Procedure Laterality Date  . CESAREAN SECTION      Current Medications: Current Meds  Medication Sig  . alendronate (FOSAMAX) 70 MG tablet Take 70 mg by mouth once a week. Take with a full glass of water on an empty stomach. Take on fridays or saturdays  . amLODipine (NORVASC) 5 MG tablet Take 5 mg by mouth daily.  Marland Kitchen atenolol-chlorthalidone (TENORETIC) 100-25 MG per tablet Take 1 tablet by mouth daily.  Marland Kitchen atorvastatin (LIPITOR) 20 MG tablet Take 20 mg by mouth daily.  . primidone (MYSOLINE) 50 MG tablet Take 50 mg by mouth daily.     Allergies:   Tetanus toxoids   Social History   Socioeconomic History  . Marital status: Married    Spouse name: Not on file  . Number of children: Not on file  . Years of education: Not on file  . Highest education level: Not on file  Occupational History  . Not on file  Tobacco Use  . Smoking status: Never Smoker  . Smokeless tobacco: Never Used  Vaping Use  . Vaping Use: Never used  Substance and Sexual Activity  . Alcohol use: No  . Drug use: No  . Sexual activity: Not Currently  Other Topics Concern  . Not on file  Social History Narrative  . Not on file   Social Determinants of Health   Financial Resource Strain:   .  Difficulty of Paying Living Expenses: Not on file  Food Insecurity:   . Worried About Charity fundraiser in the Last Year: Not on file  . Ran Out of Food in the Last Year: Not on file  Transportation Needs:   . Lack of Transportation (Medical): Not on file  . Lack of Transportation (Non-Medical): Not on file  Physical Activity:   . Days of Exercise per Week: Not on file  . Minutes of Exercise per Session: Not on file  Stress:   . Feeling of Stress : Not on file  Social Connections:   . Frequency of Communication with Friends and Family: Not on file  . Frequency of Social Gatherings with Friends and Family: Not on file  . Attends Religious Services: Not on file  . Active Member of Clubs or Organizations: Not on file  . Attends Archivist Meetings: Not on file  . Marital Status: Not on file     Family History: The patient's family history is not on file.  ROS:   Please see the history of present illness.    No complaints.  She got the booster shot and developed some symptoms.  All other systems reviewed and are negative.  EKGs/Labs/Other Studies Reviewed:    The following studies were reviewed  today: Continuous Monitor 2021: Study Highlights   NSR and sinus bradycardia are the predominant arrhytmias  SVT up to 7 beats at 130 bpm wrere noted without symptoms.  Isolated PAC's   2 D Doppler Echocardiogram 2021: IMPRESSIONS    1. Left ventricular ejection fraction, by estimation, is 60 to 65%. The  left ventricle has normal function. The left ventricle has no regional  Rosenkranz motion abnormalities. Left ventricular diastolic parameters are  consistent with Grade I diastolic  dysfunction (impaired relaxation).  2. Right ventricular systolic function is normal. The right ventricular  size is normal. There is normal pulmonary artery systolic pressure.  3. The mitral valve is normal in structure. Trivial mitral valve  regurgitation. No evidence of mitral  stenosis.  4. The aortic valve is tricuspid. Aortic valve regurgitation is not  visualized. No aortic stenosis is present.  5. The inferior vena cava is normal in size with greater than 50%  respiratory variability, suggesting right atrial pressure of 3 mmHg.    EKG:  EKG not repeated  Recent Labs: No results found for requested labs within last 8760 hours.  Recent Lipid Panel No results found for: CHOL, TRIG, HDL, CHOLHDL, VLDL, LDLCALC, LDLDIRECT  Physical Exam:    VS:  BP 120/68   Pulse (!) 59   Ht 4\' 11"  (1.499 m)   Wt 152 lb 9.6 oz (69.2 kg)   SpO2 96%   BMI 30.82 kg/m     Wt Readings from Last 3 Encounters:  07/12/20 152 lb 9.6 oz (69.2 kg)  04/04/20 150 lb (68 kg)  04/09/18 143 lb (64.9 kg)     GEN: Compatible with age. No acute distress HEENT: Normal NECK: No JVD. LYMPHATICS: No lymphadenopathy CARDIAC:  RRR without murmur, gallop, or edema. VASCULAR:  Normal Pulses. No bruits. RESPIRATORY:  Clear to auscultation without rales, wheezing or rhonchi  ABDOMEN: Soft, non-tender, non-distended, No pulsatile mass, MUSCULOSKELETAL: No deformity  SKIN: Warm and dry NEUROLOGIC:  Alert and oriented x 3 PSYCHIATRIC:  Normal affect   ASSESSMENT:    1. Systolic murmur   2. Palpitations   3. Mixed hyperlipidemia   4. Prediabetes   5. Essential hypertension   6. Educated about COVID-19 virus infection    PLAN:    In order of problems listed above:  1. No murmur on exam today.  Structurally normal heart. 2. Resolved on beta-blocker therapy 3. Continue atorvastatin 20 mg/day with yearly liver and lipid monitoring. 4. Low carbohydrate diet and physical activity to help keep IMA globin A1c under control. 5. Target blood pressure 130/80 mmHg.  Continue Tenoretic which is a atenolol # chlorthalidone 100/25 mg/day and amlodipine 5 mg/day. 6. She is fully vaccinated and boosted.  Clinical follow-up as needed   Medication Adjustments/Labs and Tests  Ordered: Current medicines are reviewed at length with the patient today.  Concerns regarding medicines are outlined above.  No orders of the defined types were placed in this encounter.  No orders of the defined types were placed in this encounter.   There are no Patient Instructions on file for this visit.   Signed, Sinclair Grooms, MD  07/12/2020 10:32 AM    [place]

## 2020-07-12 ENCOUNTER — Encounter: Payer: Self-pay | Admitting: Interventional Cardiology

## 2020-07-12 ENCOUNTER — Ambulatory Visit (INDEPENDENT_AMBULATORY_CARE_PROVIDER_SITE_OTHER): Payer: Medicare Other | Admitting: Interventional Cardiology

## 2020-07-12 ENCOUNTER — Other Ambulatory Visit: Payer: Self-pay

## 2020-07-12 VITALS — BP 120/68 | HR 59 | Ht 59.0 in | Wt 152.6 lb

## 2020-07-12 DIAGNOSIS — R002 Palpitations: Secondary | ICD-10-CM

## 2020-07-12 DIAGNOSIS — I10 Essential (primary) hypertension: Secondary | ICD-10-CM | POA: Diagnosis not present

## 2020-07-12 DIAGNOSIS — R7303 Prediabetes: Secondary | ICD-10-CM | POA: Diagnosis not present

## 2020-07-12 DIAGNOSIS — E782 Mixed hyperlipidemia: Secondary | ICD-10-CM

## 2020-07-12 DIAGNOSIS — Z7189 Other specified counseling: Secondary | ICD-10-CM | POA: Diagnosis not present

## 2020-07-12 DIAGNOSIS — R011 Cardiac murmur, unspecified: Secondary | ICD-10-CM | POA: Diagnosis not present

## 2020-07-12 NOTE — Patient Instructions (Signed)
Medication Instructions:  Your physician recommends that you continue on your current medications as directed. Please refer to the Current Medication list given to you today.  *If you need a refill on your cardiac medications before your next appointment, please call your pharmacy*   Lab Work: None If you have labs (blood work) drawn today and your tests are completely normal, you will receive your results only by: . MyChart Message (if you have MyChart) OR . A paper copy in the mail If you have any lab test that is abnormal or we need to change your treatment, we will call you to review the results.   Testing/Procedures: None   Follow-Up: At [facility], you and your health needs are our priority.  As part of our continuing mission to provide you with exceptional heart care, we have created designated Provider Care Teams.  These Care Teams include your primary Cardiologist (physician) and Advanced Practice Providers (APPs -  Physician Assistants and Nurse Practitioners) who all work together to provide you with the care you need, when you need it.  We recommend signing up for the patient portal called "MyChart".  Sign up information is provided on this After Visit Summary.  MyChart is used to connect with patients for Virtual Visits (Telemedicine).  Patients are able to view lab/test results, encounter notes, upcoming appointments, etc.  Non-urgent messages can be sent to your provider as well.   To learn more about what you can do with MyChart, go to [email or web address].    Your next appointment:   As needed  The format for your next appointment:   In Person  Provider:   You may see Henry W Smith III, MD or one of the following Advanced Practice Providers on your designated Care Team:    Lori Gerhardt, NP  Laura Ingold, NP  Jill McDaniel, NP    Other Instructions   

## 2020-07-17 DIAGNOSIS — D225 Melanocytic nevi of trunk: Secondary | ICD-10-CM | POA: Diagnosis not present

## 2020-07-17 DIAGNOSIS — Z1283 Encounter for screening for malignant neoplasm of skin: Secondary | ICD-10-CM | POA: Diagnosis not present

## 2020-07-17 DIAGNOSIS — L820 Inflamed seborrheic keratosis: Secondary | ICD-10-CM | POA: Diagnosis not present

## 2020-07-17 DIAGNOSIS — B078 Other viral warts: Secondary | ICD-10-CM | POA: Diagnosis not present

## 2020-08-18 DIAGNOSIS — Z23 Encounter for immunization: Secondary | ICD-10-CM | POA: Diagnosis not present

## 2020-08-28 DIAGNOSIS — R7309 Other abnormal glucose: Secondary | ICD-10-CM | POA: Diagnosis not present

## 2020-08-28 DIAGNOSIS — M85852 Other specified disorders of bone density and structure, left thigh: Secondary | ICD-10-CM | POA: Diagnosis not present

## 2020-08-28 DIAGNOSIS — R945 Abnormal results of liver function studies: Secondary | ICD-10-CM | POA: Diagnosis not present

## 2020-08-28 DIAGNOSIS — E782 Mixed hyperlipidemia: Secondary | ICD-10-CM | POA: Diagnosis not present

## 2020-08-28 DIAGNOSIS — I10 Essential (primary) hypertension: Secondary | ICD-10-CM | POA: Diagnosis not present

## 2020-08-28 DIAGNOSIS — Z1389 Encounter for screening for other disorder: Secondary | ICD-10-CM | POA: Diagnosis not present

## 2020-08-28 DIAGNOSIS — Z8619 Personal history of other infectious and parasitic diseases: Secondary | ICD-10-CM | POA: Diagnosis not present

## 2020-08-28 DIAGNOSIS — J309 Allergic rhinitis, unspecified: Secondary | ICD-10-CM | POA: Diagnosis not present

## 2020-08-28 DIAGNOSIS — M109 Gout, unspecified: Secondary | ICD-10-CM | POA: Diagnosis not present

## 2020-08-28 DIAGNOSIS — Z0000 Encounter for general adult medical examination without abnormal findings: Secondary | ICD-10-CM | POA: Diagnosis not present

## 2020-08-28 DIAGNOSIS — R251 Tremor, unspecified: Secondary | ICD-10-CM | POA: Diagnosis not present

## 2020-08-28 DIAGNOSIS — E559 Vitamin D deficiency, unspecified: Secondary | ICD-10-CM | POA: Diagnosis not present

## 2020-10-06 DIAGNOSIS — M8580 Other specified disorders of bone density and structure, unspecified site: Secondary | ICD-10-CM | POA: Diagnosis not present

## 2020-10-06 DIAGNOSIS — I10 Essential (primary) hypertension: Secondary | ICD-10-CM | POA: Diagnosis not present

## 2020-10-06 DIAGNOSIS — E782 Mixed hyperlipidemia: Secondary | ICD-10-CM | POA: Diagnosis not present

## 2020-11-28 ENCOUNTER — Encounter (HOSPITAL_BASED_OUTPATIENT_CLINIC_OR_DEPARTMENT_OTHER): Payer: Self-pay

## 2020-11-28 ENCOUNTER — Emergency Department (HOSPITAL_BASED_OUTPATIENT_CLINIC_OR_DEPARTMENT_OTHER)
Admission: EM | Admit: 2020-11-28 | Discharge: 2020-11-28 | Disposition: A | Discharge status: Home / Self Care | Payer: Medicare Other | Attending: Emergency Medicine | Admitting: Emergency Medicine

## 2020-11-28 ENCOUNTER — Other Ambulatory Visit: Payer: Self-pay

## 2020-11-28 ENCOUNTER — Emergency Department (HOSPITAL_BASED_OUTPATIENT_CLINIC_OR_DEPARTMENT_OTHER): Payer: Medicare Other

## 2020-11-28 DIAGNOSIS — R0602 Shortness of breath: Secondary | ICD-10-CM | POA: Diagnosis not present

## 2020-11-28 DIAGNOSIS — J90 Pleural effusion, not elsewhere classified: Secondary | ICD-10-CM | POA: Diagnosis not present

## 2020-11-28 DIAGNOSIS — Z20822 Contact with and (suspected) exposure to covid-19: Secondary | ICD-10-CM | POA: Insufficient documentation

## 2020-11-28 DIAGNOSIS — J189 Pneumonia, unspecified organism: Secondary | ICD-10-CM | POA: Diagnosis not present

## 2020-11-28 DIAGNOSIS — R059 Cough, unspecified: Secondary | ICD-10-CM | POA: Diagnosis not present

## 2020-11-28 DIAGNOSIS — I10 Essential (primary) hypertension: Secondary | ICD-10-CM | POA: Diagnosis not present

## 2020-11-28 DIAGNOSIS — R079 Chest pain, unspecified: Secondary | ICD-10-CM | POA: Diagnosis not present

## 2020-11-28 DIAGNOSIS — Z79899 Other long term (current) drug therapy: Secondary | ICD-10-CM | POA: Diagnosis not present

## 2020-11-28 DIAGNOSIS — J181 Lobar pneumonia, unspecified organism: Secondary | ICD-10-CM | POA: Insufficient documentation

## 2020-11-28 LAB — SARS CORONAVIRUS 2 (TAT 6-24 HRS): SARS Coronavirus 2: NEGATIVE

## 2020-11-28 MED ORDER — ALBUTEROL SULFATE HFA 108 (90 BASE) MCG/ACT IN AERS
2.0000 | INHALATION_SPRAY | Freq: Once | RESPIRATORY_TRACT | Status: AC
  Administered 2020-11-28: 2 via RESPIRATORY_TRACT
  Filled 2020-11-28: qty 6.7

## 2020-11-28 MED ORDER — DOXYCYCLINE HYCLATE 100 MG PO CAPS: 100.0000 mg | ORAL_CAPSULE | Freq: Two times a day (BID) | ORAL | 0 refills | Status: DC

## 2020-11-28 NOTE — Discharge Instructions (Signed)
If you develop high fever, severe cough or cough with blood, trouble breathing, severe headache, neck pain/stiffness, vomiting, or any other new/concerning symptoms then return to the ER for evaluation  

## 2020-11-28 NOTE — ED Triage Notes (Signed)
Pt arrives c/o chest congestion and cough since Saturday. Denies SOB/chest pain, does have left rib pain. Came in because she says "it isnt going away". Vaccinated x3 for COVID.

## 2020-11-28 NOTE — ED Provider Notes (Signed)
Lauren Key   CSN: [id number] Arrival date & time: 11/28/20  1116     History Chief Complaint  Patient presents with  . Cough    Lauren Key is a 80 y.o. female.  HPI 80 year old female presents with cough and congestion.  Has been feeling this way for a couple days.  Has had low-grade temperature in the 99 range.  Has a cough with some green/gray sputum.  She does not feel short of breath or have chest pain though a little bit of rib pain in the left side.  She has nasal congestion, chest congestion and rhinorrhea.  Does not seem to be improving with Delsym and Mucinex.  Has a history of bronchitis and this feels a little similar.   Past Medical History:  Diagnosis Date  . Hypertension     Patient Active Problem List   Diagnosis Date Noted  . Sepsis ([place]) 10/12/2013  . Community acquired pneumonia 10/11/2013  . Hypertension 10/11/2013  . Hypokalemia 10/11/2013    Past Surgical History:  Procedure Laterality Date  . CESAREAN SECTION       OB History   No obstetric history on file.     History reviewed. No pertinent family history.  Social History   Tobacco Use  . Smoking status: Never Smoker  . Smokeless tobacco: Never Used  Vaping Use  . Vaping Use: Never used  Substance Use Topics  . Alcohol use: No  . Drug use: No    Home Medications Prior to Admission medications   Medication Sig Start Date End Date Taking? Authorizing Provider  alendronate (FOSAMAX) 70 MG tablet Take 70 mg by mouth once a week. Take with a full glass of water on an empty stomach. Take on fridays or saturdays   Yes [provider]  amLODipine (NORVASC) 5 MG tablet Take 5 mg by mouth daily.   Yes [provider]  atenolol-chlorthalidone (TENORETIC) 100-25 MG per tablet Take 1 tablet by mouth daily.   Yes [provider]  atorvastatin (LIPITOR) 20 MG tablet Take 20 mg by mouth daily.   Yes [provider]  doxycycline (VIBRAMYCIN) 100 MG capsule Take 1 capsule (100 mg total) by mouth 2 (two) times daily. 11/28/20  Yes [name], MD  primidone (MYSOLINE) 50 MG tablet Take 50 mg by mouth daily. 03/31/20  Yes [provider]    Allergies    Tetanus toxoids  Review of Systems   Review of Systems  Constitutional: Negative for fever.  HENT: Positive for congestion and rhinorrhea.   Respiratory: Positive for cough. Negative for shortness of breath.   Cardiovascular: Negative for chest pain and leg swelling.  All other systems reviewed and are negative.   Physical Exam Updated Vital Signs BP (!) 158/60 (BP Location: Left Arm)   Pulse 80   Temp 99.6 F (37.6 C)   Resp 18   Ht 4\' 11"  (1.499 m)   Wt 69.4 kg   SpO2 93%   BMI 30.90 kg/m   Physical Exam Vitals and nursing Key reviewed.  Constitutional:      General: She is not in acute distress.    Appearance: She is well-developed. She is not ill-appearing or diaphoretic.  HENT:     Head: Normocephalic and atraumatic.     Right Ear: External ear normal.     Left Ear: External ear normal.     Nose: Nose normal.  Eyes:  General:        Right eye: No discharge.        Left eye: No discharge.  Cardiovascular:     Rate and Rhythm: Normal rate and regular rhythm.     Heart sounds: Normal heart sounds.  Pulmonary:     Effort: Pulmonary effort is normal.     Breath sounds: Normal breath sounds. No rales.  Abdominal:     Palpations: Abdomen is soft.     Tenderness: There is no abdominal tenderness.  Musculoskeletal:     Right lower leg: No edema.     Left lower leg: No edema.  Skin:    General: Skin is warm and dry.  Neurological:     Mental Status: She is alert.  Psychiatric:        Mood and Affect: Mood is not anxious.     ED Results / Procedures / Treatments   Labs (all labs ordered are listed, but only abnormal results are displayed) Labs Reviewed  SARS CORONAVIRUS 2 (TAT 6-24 HRS)     EKG EKG Interpretation  Date/Time:  Tuesday November 28 2020 11:29:59 EDT Ventricular Rate:  73 PR Interval:    QRS Duration: 90 QT Interval:  395 QTC Calculation: 436 R Axis:   -29 Text Interpretation: Sinus rhythm Left ventricular hypertrophy no acute ST/T changes Confirmed by [name] [phone]) on 11/28/2020 11:32:46 AM   Radiology DG Chest Port 1 View  Result Date: 11/28/2020 CLINICAL DATA:  Cough and congestion short of breath chest pain EXAM: PORTABLE CHEST 1 VIEW COMPARISON:  04/09/2018 FINDINGS: Hypoventilation. Decreased lung volume. Mild patchy density in the left costophrenic angle consistent with small effusion and possible airspace disease. Right lung clear. Atherosclerotic calcification aortic arch. Negative for heart failure or edema. IMPRESSION: Small left effusion and possible adjacent airspace disease. This has been present on multiple prior studies and may represent scarring. Electronically Signed   By: Franchot Gallo M.D.   On: 11/28/2020 12:44    Procedures Procedures   Medications Ordered in ED Medications  albuterol (VENTOLIN HFA) 108 (90 Base) MCG/ACT inhaler 2 puff (2 puffs Inhalation Given 11/28/20 1151)    ED Course  I have reviewed the triage vital signs and the nursing notes.  Pertinent labs & imaging results that were available during my care of the patient were reviewed by me and considered in my medical decision making (see chart for details).    MDM Rules/Calculators/A&P                          Patient with questionable pneumonia on her chest x-ray.  Due to this, will give antibiotics but she otherwise appears well.  She is not hypoxic.  No significant change with albuterol.  She does Key some rib pain, but her ribs appear okay and likely this is from the pleural effusion.  I have pretty low suspicion for ACS, PE, dissection.  Given return precautions.  Will test for Covid but this is likely not. Final Clinical Impression(s) / ED  Diagnoses Final diagnoses:  Community acquired pneumonia of left lower lobe of lung    Rx / DC Orders ED Discharge Orders         Ordered    doxycycline (VIBRAMYCIN) 100 MG capsule  2 times daily        11/28/20 1303           [name], MD 11/28/20 1408

## 2020-11-28 NOTE — ED Notes (Signed)
AVS reviewed and copy of AVS also given to pt, discussed Rx provided to client and importance of completing all abx prescribed. Pt teaching on cough and deep breathing, and increasing PO fluids. Opportunity for questions provided

## 2020-12-01 ENCOUNTER — Encounter (HOSPITAL_BASED_OUTPATIENT_CLINIC_OR_DEPARTMENT_OTHER): Payer: Self-pay | Admitting: *Deleted

## 2020-12-01 ENCOUNTER — Emergency Department (HOSPITAL_BASED_OUTPATIENT_CLINIC_OR_DEPARTMENT_OTHER): Payer: Medicare Other

## 2020-12-01 ENCOUNTER — Other Ambulatory Visit: Payer: Self-pay

## 2020-12-01 ENCOUNTER — Emergency Department (HOSPITAL_BASED_OUTPATIENT_CLINIC_OR_DEPARTMENT_OTHER)
Admission: EM | Admit: 2020-12-01 | Discharge: 2020-12-01 | Disposition: A | Discharge status: Home / Self Care | Payer: Medicare Other | Attending: Emergency Medicine | Admitting: Emergency Medicine

## 2020-12-01 DIAGNOSIS — J069 Acute upper respiratory infection, unspecified: Secondary | ICD-10-CM | POA: Diagnosis not present

## 2020-12-01 DIAGNOSIS — I10 Essential (primary) hypertension: Secondary | ICD-10-CM | POA: Insufficient documentation

## 2020-12-01 DIAGNOSIS — Z79899 Other long term (current) drug therapy: Secondary | ICD-10-CM | POA: Insufficient documentation

## 2020-12-01 DIAGNOSIS — R059 Cough, unspecified: Secondary | ICD-10-CM | POA: Diagnosis not present

## 2020-12-01 MED ORDER — HYDROCOD POLST-CPM POLST ER 10-8 MG/5ML PO SUER
5.0000 mL | Freq: Once | ORAL | Status: AC
  Administered 2020-12-01: 5 mL via ORAL
  Filled 2020-12-01: qty 5

## 2020-12-01 MED ORDER — HYDROCOD POLST-CPM POLST ER 10-8 MG/5ML PO SUER: 5.0000 mL | Freq: Two times a day (BID) | ORAL | 0 refills | Status: DC | PRN

## 2020-12-01 NOTE — Discharge Instructions (Addendum)
Finish your antibiotics.  Return if worse.  

## 2020-12-01 NOTE — ED Provider Notes (Signed)
[facility] EMERGENC[facility] DEPARTMENT Provider Note   CSN: [id number] Arrival date & time: 12/01/20  1347     History Chief Complaint  Patient presents with  . Cough    Lauren Key is a 80 y.o. female.  Pt presents to the ED today with continued cough.  Pt said she has had a cough for several days.  She was seen in the ED on 3/15.  CXR showed a pneumonia.  Covid negative.  Pt was d/c with doxycycline.  Pt said she is not getting any better.  She has a cough that keeps her up and hurts her chest and back.        Past Medical History:  Diagnosis Date  . Hypertension     Patient Active Problem List   Diagnosis Date Noted  . Sepsis ([place]) 10/12/2013  . Community acquired pneumonia 10/11/2013  . Hypertension 10/11/2013  . Hypokalemia 10/11/2013    Past Surgical History:  Procedure Laterality Date  . CESAREAN SECTION       OB History   No obstetric history on file.     No family history on file.  Social History   Tobacco Use  . Smoking status: Never Smoker  . Smokeless tobacco: Never Used  Vaping Use  . Vaping Use: Never used  Substance Use Topics  . Alcohol use: No  . Drug use: No    Home Medications Prior to Admission medications   Medication Sig Start Date End Date Taking? Authorizing Provider  amLODipine (NORVASC) 5 MG tablet Take 5 mg by mouth daily.   Yes [provider]  atenolol-chlorthalidone (TENORETIC) 100-25 MG per tablet Take 1 tablet by mouth daily.   Yes [provider]  atorvastatin (LIPITOR) 20 MG tablet Take 20 mg by mouth daily.   Yes [provider]  chlorpheniramine-HYDROcodone (TUSSIONEX PENNKINETIC ER) 10-8 MG/5ML SUER Take 5 mLs by mouth every 12 (twelve) hours as needed for cough. 12/01/20  Yes [name], MD  doxycycline (VIBRAMYCIN) 100 MG capsule Take 1 capsule (100 mg total) by mouth 2 (two) times daily. 11/28/20  Yes [name], MD  primidone (MYSOLINE) 50 MG tablet Take 50 mg by  mouth daily. 03/31/20  Yes [provider]  alendronate (FOSAMAX) 70 MG tablet Take 70 mg by mouth once a week. Take with a full glass of water on an empty stomach. Take on fridays or saturdays    [provider]    Allergies    Tetanus toxoids  Review of Systems   Review of Systems  Respiratory: Positive for cough.   All other systems reviewed and are negative.   Physical Exam Updated Vital Signs BP (!) 133/55 (BP Location: Right Arm)   Pulse 62   Temp 99 F (37.2 C) (Oral)   Resp 18   Ht 4\' 11"  (1.499 m)   Wt 69.4 kg   SpO2 95%   BMI 30.90 kg/m   Physical Exam Vitals and nursing note reviewed.  Constitutional:      Appearance: Normal appearance.  HENT:     Head: Normocephalic and atraumatic.     Right Ear: External ear normal.     Left Ear: External ear normal.     Nose: Rhinorrhea present.     Mouth/Throat:     Mouth: Mucous membranes are moist.     Pharynx: Oropharynx is clear.  Eyes:     Extraocular Movements: Extraocular movements intact.     Conjunctiva/sclera: Conjunctivae normal.  Pupils: Pupils are equal, round, and reactive to light.  Cardiovascular:     Rate and Rhythm: Normal rate and regular rhythm.     Pulses: Normal pulses.     Heart sounds: Normal heart sounds.  Pulmonary:     Effort: Pulmonary effort is normal.     Breath sounds: Normal breath sounds.  Abdominal:     General: Abdomen is flat. Bowel sounds are normal.     Palpations: Abdomen is soft.  Musculoskeletal:        General: Normal range of motion.     Cervical back: Normal range of motion and neck supple.  Skin:    General: Skin is warm.     Capillary Refill: Capillary refill takes less than 2 seconds.  Neurological:     General: No focal deficit present.     Mental Status: She is alert and oriented to person, place, and time.  Psychiatric:        Mood and Affect: Mood normal.        Behavior: Behavior normal.        Thought Content: Thought content normal.         Judgment: Judgment normal.     ED Results / Procedures / Treatments   Labs (all labs ordered are listed, but only abnormal results are displayed) Labs Reviewed - No data to display  EKG None  Radiology No results found.  Procedures Procedures   Medications Ordered in ED Medications  chlorpheniramine-HYDROcodone (TUSSIONEX) 10-8 MG/5ML suspension 5 mL (5 mLs Oral Given 12/01/20 1419)    ED Course  I have reviewed the triage vital signs and the nursing notes.  Pertinent labs & imaging results that were available during my care of the patient were reviewed by me and considered in my medical decision making (see chart for details).    MDM Rules/Calculators/A&P                          Pt is feeling better after tussionex.  She is d/c with a rx for Tussionex.  She is to continue her doxy.  Return if worse.  Final Clinical Impression(s) / ED Diagnoses Final diagnoses:  Upper respiratory tract infection, unspecified type    Rx / DC Orders ED Discharge Orders         Ordered    chlorpheniramine-HYDROcodone (TUSSIONEX PENNKINETIC ER) 10-8 MG/5ML SUER  Every 12 hours PRN        12/01/20 1454           [name], MD 12/01/20 [phone]

## 2020-12-01 NOTE — ED Triage Notes (Signed)
Cough and runny nose. She was seen for same 3 days ago. States cough continues.

## 2021-02-26 DIAGNOSIS — I10 Essential (primary) hypertension: Secondary | ICD-10-CM | POA: Diagnosis not present

## 2021-02-26 DIAGNOSIS — Z8619 Personal history of other infectious and parasitic diseases: Secondary | ICD-10-CM | POA: Diagnosis not present

## 2021-02-26 DIAGNOSIS — M109 Gout, unspecified: Secondary | ICD-10-CM | POA: Diagnosis not present

## 2021-02-26 DIAGNOSIS — R251 Tremor, unspecified: Secondary | ICD-10-CM | POA: Diagnosis not present

## 2021-02-26 DIAGNOSIS — E559 Vitamin D deficiency, unspecified: Secondary | ICD-10-CM | POA: Diagnosis not present

## 2021-02-26 DIAGNOSIS — Z8719 Personal history of other diseases of the digestive system: Secondary | ICD-10-CM | POA: Diagnosis not present

## 2021-02-26 DIAGNOSIS — M85852 Other specified disorders of bone density and structure, left thigh: Secondary | ICD-10-CM | POA: Diagnosis not present

## 2021-02-26 DIAGNOSIS — R7303 Prediabetes: Secondary | ICD-10-CM | POA: Diagnosis not present

## 2021-02-26 DIAGNOSIS — K5900 Constipation, unspecified: Secondary | ICD-10-CM | POA: Diagnosis not present

## 2021-02-26 DIAGNOSIS — J309 Allergic rhinitis, unspecified: Secondary | ICD-10-CM | POA: Diagnosis not present

## 2021-02-26 DIAGNOSIS — R945 Abnormal results of liver function studies: Secondary | ICD-10-CM | POA: Diagnosis not present

## 2021-02-26 DIAGNOSIS — E782 Mixed hyperlipidemia: Secondary | ICD-10-CM | POA: Diagnosis not present

## 2021-03-26 DIAGNOSIS — L820 Inflamed seborrheic keratosis: Secondary | ICD-10-CM | POA: Diagnosis not present

## 2021-03-26 DIAGNOSIS — L728 Other follicular cysts of the skin and subcutaneous tissue: Secondary | ICD-10-CM | POA: Diagnosis not present

## 2021-03-26 DIAGNOSIS — B078 Other viral warts: Secondary | ICD-10-CM | POA: Diagnosis not present

## 2021-05-23 DIAGNOSIS — E782 Mixed hyperlipidemia: Secondary | ICD-10-CM | POA: Diagnosis not present

## 2021-05-23 DIAGNOSIS — M8580 Other specified disorders of bone density and structure, unspecified site: Secondary | ICD-10-CM | POA: Diagnosis not present

## 2021-05-23 DIAGNOSIS — I10 Essential (primary) hypertension: Secondary | ICD-10-CM | POA: Diagnosis not present

## 2021-06-18 DIAGNOSIS — H9202 Otalgia, left ear: Secondary | ICD-10-CM | POA: Diagnosis not present

## 2021-06-18 DIAGNOSIS — J029 Acute pharyngitis, unspecified: Secondary | ICD-10-CM | POA: Diagnosis not present

## 2021-07-19 DIAGNOSIS — M8580 Other specified disorders of bone density and structure, unspecified site: Secondary | ICD-10-CM | POA: Diagnosis not present

## 2021-07-19 DIAGNOSIS — I10 Essential (primary) hypertension: Secondary | ICD-10-CM | POA: Diagnosis not present

## 2021-07-19 DIAGNOSIS — E782 Mixed hyperlipidemia: Secondary | ICD-10-CM | POA: Diagnosis not present

## 2021-07-26 DIAGNOSIS — J209 Acute bronchitis, unspecified: Secondary | ICD-10-CM | POA: Diagnosis not present

## 2021-08-08 IMAGING — CR DG CHEST 2V
2 series · 2 of 2 positions shown · non-contrast
Comparison: 11/28/2020

CLINICAL DATA: Cough and runny nose for 3 days

EXAM:
CHEST - 2 VIEW

[w chest pa]
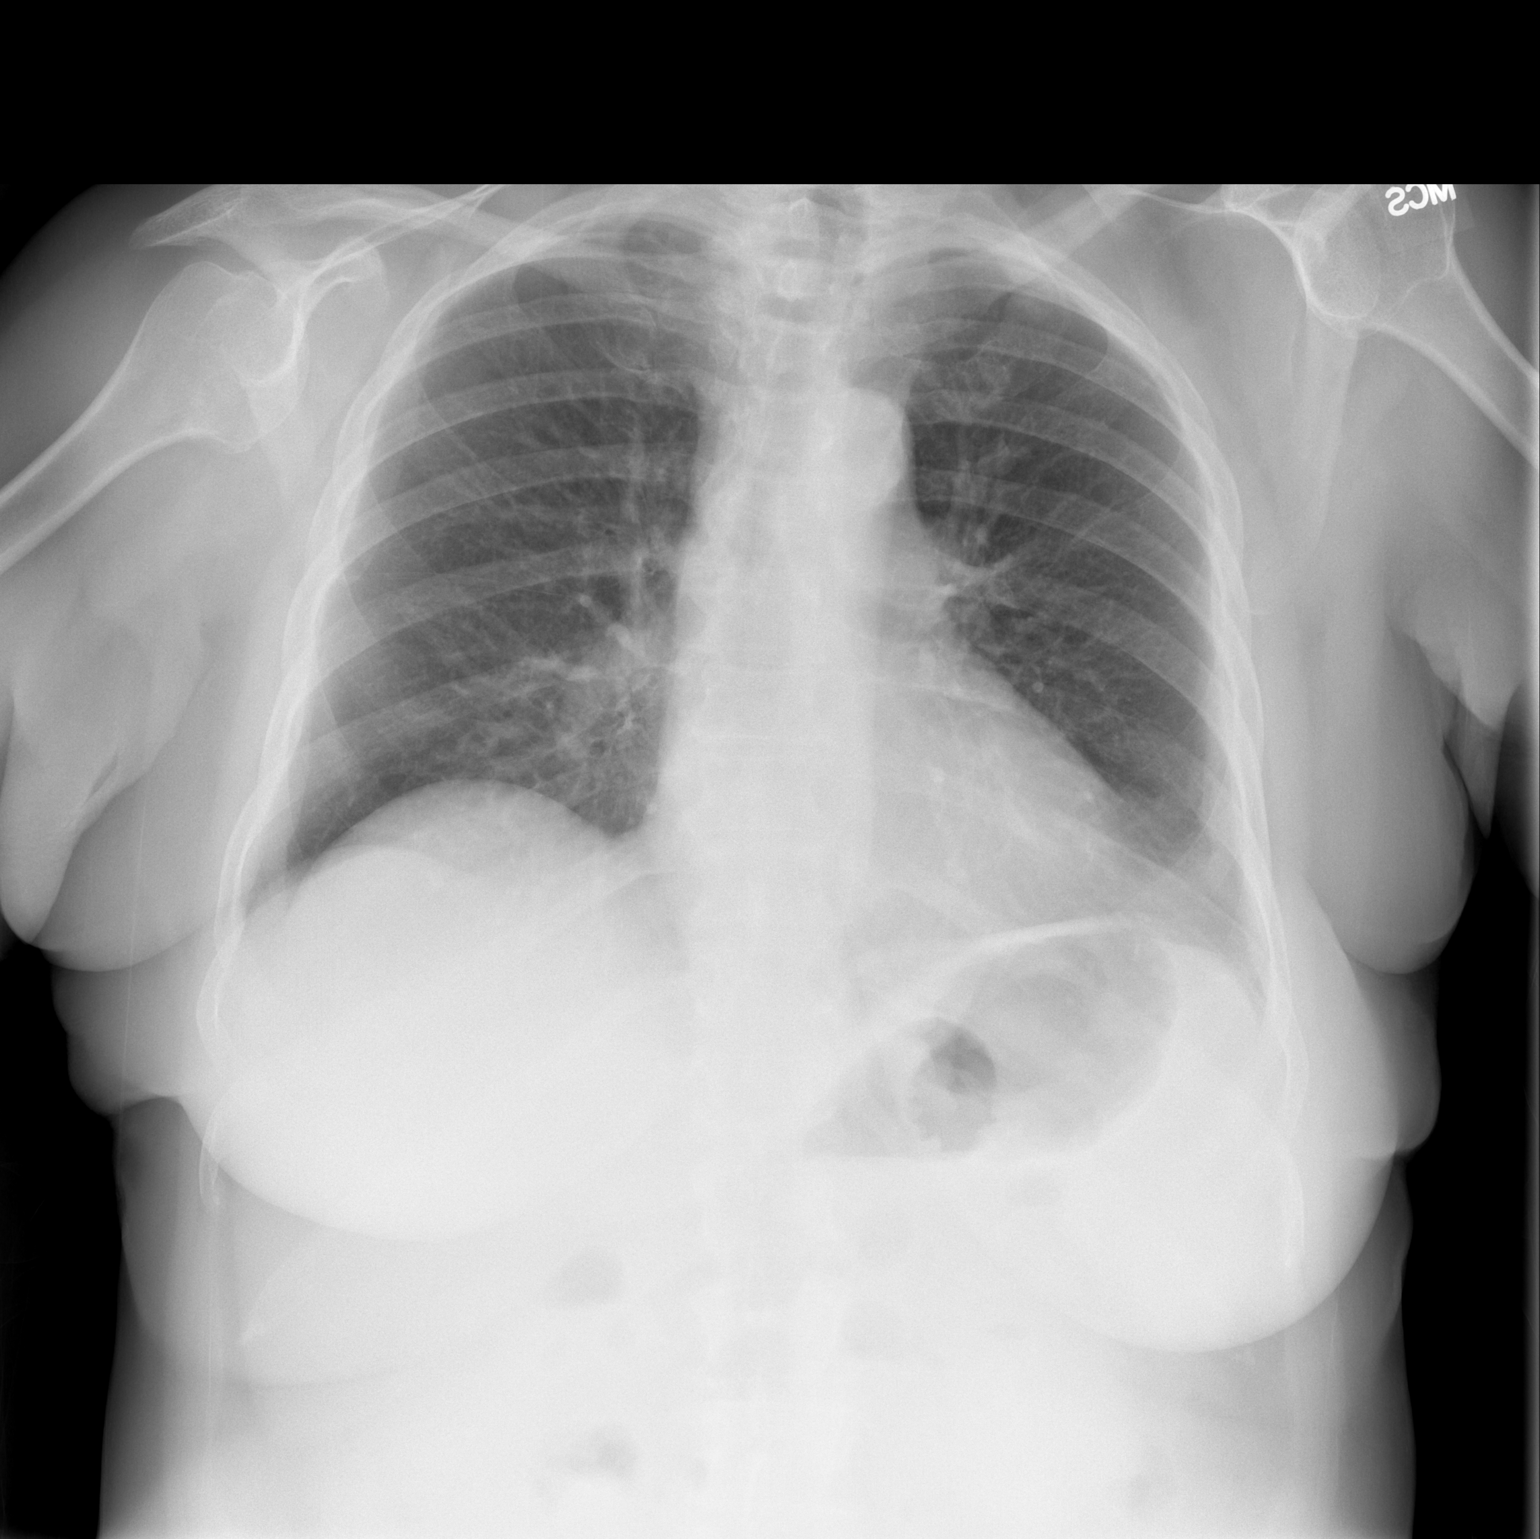

[w chest lat]
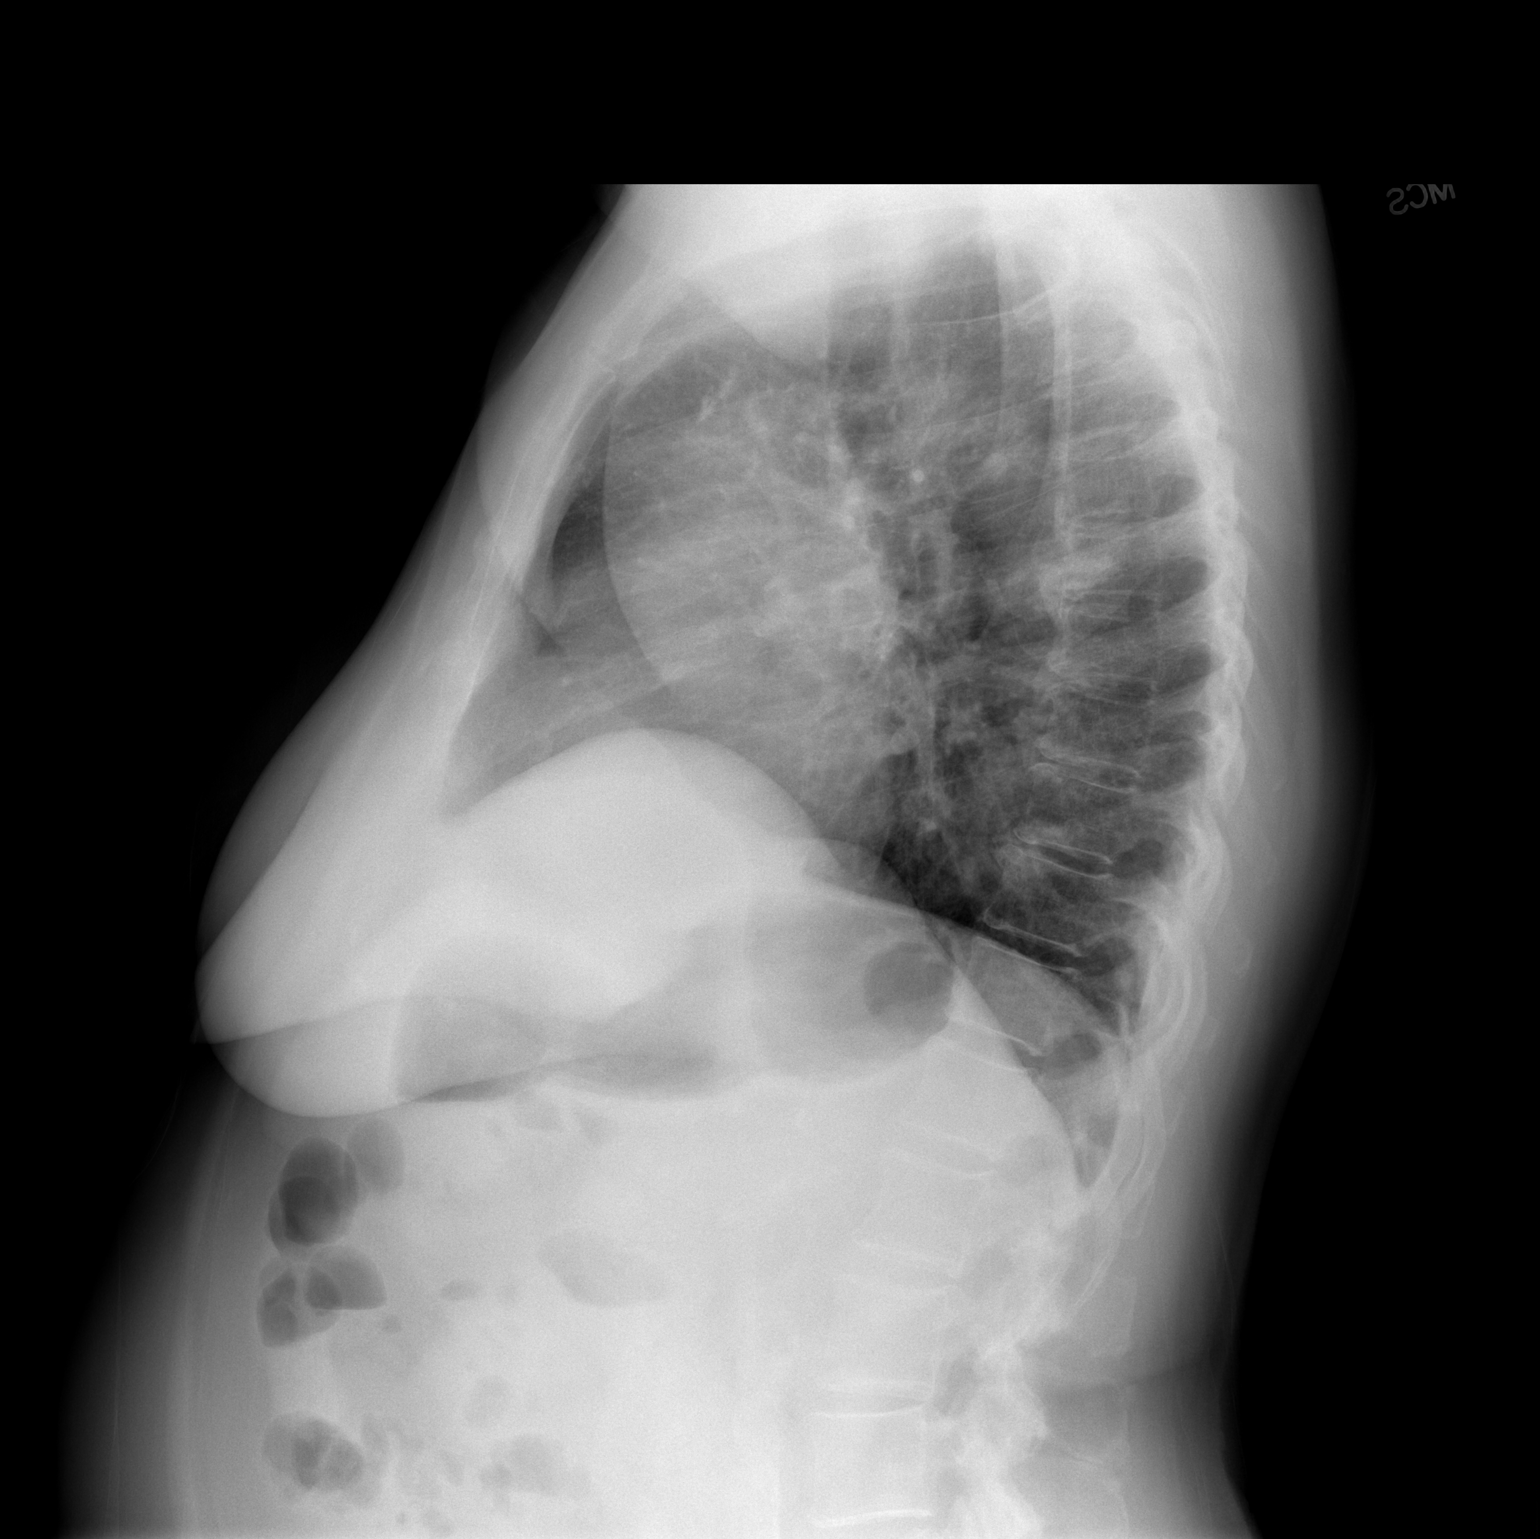

[2 of 2 positions shown; findings below may reference images not displayed]

FINDINGS: Normal heart size, mediastinal contours, and pulmonary vascularity.

Minimal bronchitic changes.

No pulmonary infiltrate, pleural effusion, or pneumothorax.

Atherosclerotic calcification aorta, better visualized on previous
exam.

No acute osseous findings.
IMPRESSION: Minimal bronchitic changes without infiltrate.

## 2021-08-24 DIAGNOSIS — M8580 Other specified disorders of bone density and structure, unspecified site: Secondary | ICD-10-CM | POA: Diagnosis not present

## 2021-08-24 DIAGNOSIS — E782 Mixed hyperlipidemia: Secondary | ICD-10-CM | POA: Diagnosis not present

## 2021-08-24 DIAGNOSIS — I10 Essential (primary) hypertension: Secondary | ICD-10-CM | POA: Diagnosis not present

## 2021-10-04 DIAGNOSIS — Z0000 Encounter for general adult medical examination without abnormal findings: Secondary | ICD-10-CM | POA: Diagnosis not present

## 2021-10-04 DIAGNOSIS — Z23 Encounter for immunization: Secondary | ICD-10-CM | POA: Diagnosis not present

## 2021-10-04 DIAGNOSIS — E559 Vitamin D deficiency, unspecified: Secondary | ICD-10-CM | POA: Diagnosis not present

## 2021-10-04 DIAGNOSIS — E782 Mixed hyperlipidemia: Secondary | ICD-10-CM | POA: Diagnosis not present

## 2021-10-04 DIAGNOSIS — I10 Essential (primary) hypertension: Secondary | ICD-10-CM | POA: Diagnosis not present

## 2021-10-04 DIAGNOSIS — R945 Abnormal results of liver function studies: Secondary | ICD-10-CM | POA: Diagnosis not present

## 2021-10-04 DIAGNOSIS — M85852 Other specified disorders of bone density and structure, left thigh: Secondary | ICD-10-CM | POA: Diagnosis not present

## 2021-10-04 DIAGNOSIS — R7303 Prediabetes: Secondary | ICD-10-CM | POA: Diagnosis not present

## 2021-10-04 DIAGNOSIS — M109 Gout, unspecified: Secondary | ICD-10-CM | POA: Diagnosis not present

## 2021-10-04 DIAGNOSIS — Z1389 Encounter for screening for other disorder: Secondary | ICD-10-CM | POA: Diagnosis not present

## 2021-10-04 DIAGNOSIS — R229 Localized swelling, mass and lump, unspecified: Secondary | ICD-10-CM | POA: Diagnosis not present

## 2021-10-04 DIAGNOSIS — R251 Tremor, unspecified: Secondary | ICD-10-CM | POA: Diagnosis not present

## 2022-03-18 ENCOUNTER — Other Ambulatory Visit: Payer: Self-pay | Admitting: Family Medicine

## 2022-03-18 DIAGNOSIS — M109 Gout, unspecified: Secondary | ICD-10-CM | POA: Diagnosis not present

## 2022-03-18 DIAGNOSIS — Z8719 Personal history of other diseases of the digestive system: Secondary | ICD-10-CM | POA: Diagnosis not present

## 2022-03-18 DIAGNOSIS — E559 Vitamin D deficiency, unspecified: Secondary | ICD-10-CM | POA: Diagnosis not present

## 2022-03-18 DIAGNOSIS — K5900 Constipation, unspecified: Secondary | ICD-10-CM | POA: Diagnosis not present

## 2022-03-18 DIAGNOSIS — M85852 Other specified disorders of bone density and structure, left thigh: Secondary | ICD-10-CM | POA: Diagnosis not present

## 2022-03-18 DIAGNOSIS — I10 Essential (primary) hypertension: Secondary | ICD-10-CM | POA: Diagnosis not present

## 2022-03-18 DIAGNOSIS — R7303 Prediabetes: Secondary | ICD-10-CM | POA: Diagnosis not present

## 2022-03-18 DIAGNOSIS — R945 Abnormal results of liver function studies: Secondary | ICD-10-CM | POA: Diagnosis not present

## 2022-03-18 DIAGNOSIS — R251 Tremor, unspecified: Secondary | ICD-10-CM | POA: Diagnosis not present

## 2022-03-18 DIAGNOSIS — J309 Allergic rhinitis, unspecified: Secondary | ICD-10-CM | POA: Diagnosis not present

## 2022-03-18 DIAGNOSIS — E782 Mixed hyperlipidemia: Secondary | ICD-10-CM | POA: Diagnosis not present

## 2022-03-18 DIAGNOSIS — Z8619 Personal history of other infectious and parasitic diseases: Secondary | ICD-10-CM | POA: Diagnosis not present

## 2022-03-20 ENCOUNTER — Other Ambulatory Visit: Payer: Self-pay | Admitting: Family Medicine

## 2022-03-20 DIAGNOSIS — R7989 Other specified abnormal findings of blood chemistry: Secondary | ICD-10-CM

## 2022-03-21 ENCOUNTER — Ambulatory Visit
Admission: RE | Admit: 2022-03-21 | Discharge: 2022-03-21 | Disposition: A | Discharge status: Home / Self Care | Payer: Medicare Other | Source: Ambulatory Visit | Attending: Family Medicine | Admitting: Family Medicine

## 2022-03-21 DIAGNOSIS — R945 Abnormal results of liver function studies: Secondary | ICD-10-CM | POA: Diagnosis not present

## 2022-03-21 DIAGNOSIS — R7989 Other specified abnormal findings of blood chemistry: Secondary | ICD-10-CM

## 2022-03-21 DIAGNOSIS — K8020 Calculus of gallbladder without cholecystitis without obstruction: Secondary | ICD-10-CM | POA: Diagnosis not present

## 2022-03-26 DIAGNOSIS — Z1283 Encounter for screening for malignant neoplasm of skin: Secondary | ICD-10-CM | POA: Diagnosis not present

## 2022-03-26 DIAGNOSIS — Z08 Encounter for follow-up examination after completed treatment for malignant neoplasm: Secondary | ICD-10-CM | POA: Diagnosis not present

## 2022-03-26 DIAGNOSIS — Z85820 Personal history of malignant melanoma of skin: Secondary | ICD-10-CM | POA: Diagnosis not present

## 2022-03-26 DIAGNOSIS — D225 Melanocytic nevi of trunk: Secondary | ICD-10-CM | POA: Diagnosis not present

## 2022-07-28 DIAGNOSIS — J069 Acute upper respiratory infection, unspecified: Secondary | ICD-10-CM | POA: Diagnosis not present

## 2022-07-28 DIAGNOSIS — R062 Wheezing: Secondary | ICD-10-CM | POA: Diagnosis not present

## 2022-07-30 DIAGNOSIS — L820 Inflamed seborrheic keratosis: Secondary | ICD-10-CM | POA: Diagnosis not present

## 2022-07-30 DIAGNOSIS — L728 Other follicular cysts of the skin and subcutaneous tissue: Secondary | ICD-10-CM | POA: Diagnosis not present

## 2022-08-01 DIAGNOSIS — R059 Cough, unspecified: Secondary | ICD-10-CM | POA: Diagnosis not present

## 2022-08-01 DIAGNOSIS — J209 Acute bronchitis, unspecified: Secondary | ICD-10-CM | POA: Diagnosis not present

## 2022-08-16 ENCOUNTER — Other Ambulatory Visit: Payer: Self-pay | Admitting: Family Medicine

## 2022-08-16 ENCOUNTER — Ambulatory Visit
Admission: RE | Admit: 2022-08-16 | Discharge: 2022-08-16 | Disposition: A | Discharge status: Home / Self Care | Payer: Medicare Other | Source: Ambulatory Visit | Attending: Family Medicine | Admitting: Family Medicine

## 2022-08-16 DIAGNOSIS — R059 Cough, unspecified: Secondary | ICD-10-CM

## 2022-08-16 DIAGNOSIS — I10 Essential (primary) hypertension: Secondary | ICD-10-CM | POA: Diagnosis not present

## 2022-09-25 ENCOUNTER — Ambulatory Visit
Admission: RE | Admit: 2022-09-25 | Discharge: 2022-09-25 | Disposition: A | Discharge status: Home / Self Care | Payer: Medicare Other | Source: Ambulatory Visit | Attending: Family Medicine | Admitting: Family Medicine

## 2022-09-25 DIAGNOSIS — Z780 Asymptomatic menopausal state: Secondary | ICD-10-CM | POA: Diagnosis not present

## 2022-09-25 DIAGNOSIS — M8589 Other specified disorders of bone density and structure, multiple sites: Secondary | ICD-10-CM | POA: Diagnosis not present

## 2022-09-25 DIAGNOSIS — M85852 Other specified disorders of bone density and structure, left thigh: Secondary | ICD-10-CM

## 2022-11-01 DIAGNOSIS — Z23 Encounter for immunization: Secondary | ICD-10-CM | POA: Diagnosis not present

## 2022-11-01 DIAGNOSIS — Z0000 Encounter for general adult medical examination without abnormal findings: Secondary | ICD-10-CM | POA: Diagnosis not present

## 2022-11-01 DIAGNOSIS — I10 Essential (primary) hypertension: Secondary | ICD-10-CM | POA: Diagnosis not present

## 2022-11-01 DIAGNOSIS — M85852 Other specified disorders of bone density and structure, left thigh: Secondary | ICD-10-CM | POA: Diagnosis not present

## 2022-11-01 DIAGNOSIS — E782 Mixed hyperlipidemia: Secondary | ICD-10-CM | POA: Diagnosis not present

## 2022-11-01 DIAGNOSIS — K5900 Constipation, unspecified: Secondary | ICD-10-CM | POA: Diagnosis not present

## 2022-11-01 DIAGNOSIS — Z1331 Encounter for screening for depression: Secondary | ICD-10-CM | POA: Diagnosis not present

## 2022-11-01 DIAGNOSIS — R7303 Prediabetes: Secondary | ICD-10-CM | POA: Diagnosis not present

## 2022-11-01 DIAGNOSIS — R251 Tremor, unspecified: Secondary | ICD-10-CM | POA: Diagnosis not present

## 2022-11-01 DIAGNOSIS — K760 Fatty (change of) liver, not elsewhere classified: Secondary | ICD-10-CM | POA: Diagnosis not present

## 2022-11-01 DIAGNOSIS — M109 Gout, unspecified: Secondary | ICD-10-CM | POA: Diagnosis not present

## 2022-11-01 DIAGNOSIS — E559 Vitamin D deficiency, unspecified: Secondary | ICD-10-CM | POA: Diagnosis not present

## 2022-11-25 ENCOUNTER — Inpatient Hospital Stay (HOSPITAL_COMMUNITY)
Admission: EM | Admit: 2022-11-25 | Discharge: 2022-11-28 | DRG: 194 | Disposition: A | Discharge status: Home / Self Care | Payer: Medicare Other | Attending: Family Medicine | Admitting: Family Medicine

## 2022-11-25 ENCOUNTER — Encounter (HOSPITAL_COMMUNITY): Payer: Self-pay

## 2022-11-25 ENCOUNTER — Other Ambulatory Visit: Payer: Self-pay

## 2022-11-25 ENCOUNTER — Emergency Department (HOSPITAL_COMMUNITY): Payer: Medicare Other

## 2022-11-25 DIAGNOSIS — I130 Hypertensive heart and chronic kidney disease with heart failure and stage 1 through stage 4 chronic kidney disease, or unspecified chronic kidney disease: Secondary | ICD-10-CM | POA: Diagnosis present

## 2022-11-25 DIAGNOSIS — J9601 Acute respiratory failure with hypoxia: Secondary | ICD-10-CM

## 2022-11-25 DIAGNOSIS — E663 Overweight: Secondary | ICD-10-CM | POA: Diagnosis present

## 2022-11-25 DIAGNOSIS — Z8669 Personal history of other diseases of the nervous system and sense organs: Secondary | ICD-10-CM | POA: Diagnosis not present

## 2022-11-25 DIAGNOSIS — Z887 Allergy status to serum and vaccine status: Secondary | ICD-10-CM | POA: Diagnosis not present

## 2022-11-25 DIAGNOSIS — I10 Essential (primary) hypertension: Secondary | ICD-10-CM | POA: Diagnosis present

## 2022-11-25 DIAGNOSIS — I5032 Chronic diastolic (congestive) heart failure: Secondary | ICD-10-CM | POA: Diagnosis present

## 2022-11-25 DIAGNOSIS — Z1152 Encounter for screening for COVID-19: Secondary | ICD-10-CM

## 2022-11-25 DIAGNOSIS — E860 Dehydration: Secondary | ICD-10-CM | POA: Diagnosis present

## 2022-11-25 DIAGNOSIS — E785 Hyperlipidemia, unspecified: Secondary | ICD-10-CM | POA: Diagnosis present

## 2022-11-25 DIAGNOSIS — N179 Acute kidney failure, unspecified: Secondary | ICD-10-CM | POA: Diagnosis present

## 2022-11-25 DIAGNOSIS — R0603 Acute respiratory distress: Secondary | ICD-10-CM | POA: Diagnosis present

## 2022-11-25 DIAGNOSIS — I700 Atherosclerosis of aorta: Secondary | ICD-10-CM | POA: Diagnosis not present

## 2022-11-25 DIAGNOSIS — Z6828 Body mass index (BMI) 28.0-28.9, adult: Secondary | ICD-10-CM

## 2022-11-25 DIAGNOSIS — R6889 Other general symptoms and signs: Secondary | ICD-10-CM | POA: Diagnosis not present

## 2022-11-25 DIAGNOSIS — N1832 Chronic kidney disease, stage 3b: Secondary | ICD-10-CM | POA: Diagnosis present

## 2022-11-25 DIAGNOSIS — Z79899 Other long term (current) drug therapy: Secondary | ICD-10-CM | POA: Diagnosis not present

## 2022-11-25 DIAGNOSIS — R531 Weakness: Secondary | ICD-10-CM

## 2022-11-25 DIAGNOSIS — G40909 Epilepsy, unspecified, not intractable, without status epilepticus: Secondary | ICD-10-CM | POA: Diagnosis present

## 2022-11-25 DIAGNOSIS — J189 Pneumonia, unspecified organism: Secondary | ICD-10-CM | POA: Diagnosis not present

## 2022-11-25 DIAGNOSIS — R52 Pain, unspecified: Secondary | ICD-10-CM | POA: Diagnosis not present

## 2022-11-25 DIAGNOSIS — R051 Acute cough: Secondary | ICD-10-CM | POA: Diagnosis not present

## 2022-11-25 DIAGNOSIS — Z03818 Encounter for observation for suspected exposure to other biological agents ruled out: Secondary | ICD-10-CM | POA: Diagnosis not present

## 2022-11-25 DIAGNOSIS — R059 Cough, unspecified: Secondary | ICD-10-CM | POA: Diagnosis not present

## 2022-11-25 DIAGNOSIS — Z7983 Long term (current) use of bisphosphonates: Secondary | ICD-10-CM

## 2022-11-25 DIAGNOSIS — E782 Mixed hyperlipidemia: Secondary | ICD-10-CM | POA: Diagnosis not present

## 2022-11-25 DIAGNOSIS — R0602 Shortness of breath: Secondary | ICD-10-CM | POA: Diagnosis not present

## 2022-11-25 DIAGNOSIS — R0902 Hypoxemia: Secondary | ICD-10-CM | POA: Diagnosis not present

## 2022-11-25 DIAGNOSIS — R5383 Other fatigue: Secondary | ICD-10-CM | POA: Diagnosis not present

## 2022-11-25 HISTORY — DX: Personal history of other diseases of the nervous system and sense organs: Z86.69

## 2022-11-25 HISTORY — DX: Acute kidney failure, unspecified: N17.9

## 2022-11-25 HISTORY — DX: Weakness: R53.1

## 2022-11-25 HISTORY — DX: Hyperlipidemia, unspecified: E78.5

## 2022-11-25 HISTORY — DX: Acute respiratory failure with hypoxia: J96.01

## 2022-11-25 LAB — PROCALCITONIN: Procalcitonin: 0.29 ng/mL

## 2022-11-25 LAB — CBC WITH DIFFERENTIAL/PLATELET
Abs Immature Granulocytes: 0.02 10*3/uL (ref 0.00–0.07)
Basophils Absolute: 0 10*3/uL (ref 0.0–0.1)
Basophils Relative: 1 %
Eosinophils Absolute: 0 10*3/uL (ref 0.0–0.5)
Eosinophils Relative: 0 %
HCT: 43.5 % (ref 36.0–46.0)
Hemoglobin: 14.3 g/dL (ref 12.0–15.0)
Immature Granulocytes: 0 %
Lymphocytes Relative: 23 %
Lymphs Abs: 1.7 10*3/uL (ref 0.7–4.0)
MCH: 32.3 pg (ref 26.0–34.0)
MCHC: 32.9 g/dL (ref 30.0–36.0)
MCV: 98.2 fL (ref 80.0–100.0)
Monocytes Absolute: 0.8 10*3/uL (ref 0.1–1.0)
Monocytes Relative: 11 %
Neutro Abs: 4.8 10*3/uL (ref 1.7–7.7)
Neutrophils Relative %: 65 %
Platelets: 228 10*3/uL (ref 150–400)
RBC: 4.43 MIL/uL (ref 3.87–5.11)
RDW: 11.9 % (ref 11.5–15.5)
WBC: 7.3 10*3/uL (ref 4.0–10.5)
nRBC: 0 % (ref 0.0–0.2)

## 2022-11-25 LAB — COMPREHENSIVE METABOLIC PANEL
ALT: 40 U/L (ref 0–44)
AST: 68 U/L — ABNORMAL HIGH (ref 15–41)
Albumin: 4 g/dL (ref 3.5–5.0)
Alkaline Phosphatase: 98 U/L (ref 38–126)
Anion gap: 12 (ref 5–15)
BUN: 35 mg/dL — ABNORMAL HIGH (ref 8–23)
CO2: 23 mmol/L (ref 22–32)
Calcium: 8.7 mg/dL — ABNORMAL LOW (ref 8.9–10.3)
Chloride: 99 mmol/L (ref 98–111)
Creatinine, Ser: 1.85 mg/dL — ABNORMAL HIGH (ref 0.44–1.00)
GFR, Estimated: 27 mL/min — ABNORMAL LOW (ref >60)
Glucose, Bld: 110 mg/dL — ABNORMAL HIGH (ref 70–99)
Potassium: 4.2 mmol/L (ref 3.5–5.1)
Sodium: 134 mmol/L — ABNORMAL LOW (ref 135–145)
Total Bilirubin: 0.9 mg/dL (ref 0.3–1.2)
Total Protein: 8.1 g/dL (ref 6.5–8.1)

## 2022-11-25 LAB — TSH: TSH: 2.91 u[IU]/mL (ref 0.350–4.500)

## 2022-11-25 LAB — RESP PANEL BY RT-PCR (RSV, FLU A&B, COVID)  RVPGX2
Influenza A by PCR: NEGATIVE
Influenza B by PCR: NEGATIVE
Resp Syncytial Virus by PCR: NEGATIVE
SARS Coronavirus 2 by RT PCR: NEGATIVE

## 2022-11-25 LAB — BRAIN NATRIURETIC PEPTIDE: B Natriuretic Peptide: 33.6 pg/mL (ref 0.0–100.0)

## 2022-11-25 LAB — CK: Total CK: 172 U/L (ref 38–234)

## 2022-11-25 LAB — MAGNESIUM: Magnesium: 2.2 mg/dL (ref 1.7–2.4)

## 2022-11-25 MED ORDER — SODIUM CHLORIDE 0.9 % IV BOLUS
1000.0000 mL | Freq: Once | INTRAVENOUS | Status: AC
  Administered 2022-11-25: 1000 mL via INTRAVENOUS

## 2022-11-25 MED ORDER — LACTATED RINGERS IV SOLN: INTRAVENOUS | Status: AC

## 2022-11-25 MED ORDER — SODIUM CHLORIDE 0.9 % IV SOLN
500.0000 mg | INTRAVENOUS | Status: DC
  Administered 2022-11-26: 500 mg via INTRAVENOUS
  Filled 2022-11-25 (×2): qty 5

## 2022-11-25 MED ORDER — SODIUM CHLORIDE 0.9 % IV SOLN
500.0000 mg | Freq: Once | INTRAVENOUS | Status: AC
  Filled 2022-11-25: qty 5

## 2022-11-25 MED ORDER — MENTHOL 3 MG MT LOZG: 1.0000 | LOZENGE | OROMUCOSAL | Status: DC | PRN

## 2022-11-25 MED ORDER — BENZONATATE 100 MG PO CAPS: 200.0000 mg | ORAL_CAPSULE | Freq: Three times a day (TID) | ORAL | Status: DC | PRN

## 2022-11-25 MED ORDER — ACETAMINOPHEN 325 MG PO TABS: 650.0000 mg | ORAL_TABLET | Freq: Four times a day (QID) | ORAL | Status: DC | PRN

## 2022-11-25 MED ORDER — ACETAMINOPHEN 650 MG RE SUPP: 650.0000 mg | Freq: Four times a day (QID) | RECTAL | Status: DC | PRN

## 2022-11-25 MED ORDER — MELATONIN 3 MG PO TABS: 3.0000 mg | ORAL_TABLET | Freq: Every evening | ORAL | Status: DC | PRN

## 2022-11-25 MED ORDER — PRIMIDONE 50 MG PO TABS
50.0000 mg | ORAL_TABLET | Freq: Every day | ORAL | Status: DC
  Administered 2022-11-25 – 2022-11-27 (×3): 50 mg via ORAL
  Filled 2022-11-25 (×4): qty 1

## 2022-11-25 MED ORDER — SODIUM CHLORIDE 0.9 % IV SOLN
1.0000 g | INTRAVENOUS | Status: DC
  Administered 2022-11-26: 1 g via INTRAVENOUS
  Filled 2022-11-25 (×2): qty 10

## 2022-11-25 MED ORDER — ATORVASTATIN CALCIUM 10 MG PO TABS
20.0000 mg | ORAL_TABLET | Freq: Every day | ORAL | Status: DC
  Administered 2022-11-25 – 2022-11-28 (×4): 20 mg via ORAL
  Filled 2022-11-25 (×4): qty 2

## 2022-11-25 MED ORDER — SODIUM CHLORIDE 0.9 % IV SOLN
2.0000 g | Freq: Once | INTRAVENOUS | Status: AC
  Administered 2022-11-25: 2 g via INTRAVENOUS
  Filled 2022-11-25: qty 20

## 2022-11-25 NOTE — ED Provider Notes (Signed)
[facility] Provider Note  CSN: [id number] Arrival date & time: 11/25/22 1446  Chief Complaint(s) Pneumonia  HPI Nakari Shyna Gillogly is a 82 y.o. female history of hypertension presenting to the emergency department with cough.  Patient reports cough since last Thursday.  She also reports generalized weakness.  The cough is productive.  She denies any chest pain.  She has not really felt short of breath.  Has been reports that she has been weak and not eating much.  No nausea or vomiting, chest pain.  No abdominal pain.  She reports some congestion but no sore throat or runny nose.  No leg swelling.  To her primary doctor where they found she was hypoxic and sent her to the emergency department   Past Medical History Past Medical History:  Diagnosis Date   Hypertension    Patient Active Problem List   Diagnosis Date Noted   CAP (community acquired pneumonia) 11/25/2022   Sepsis ([place]) 10/12/2013   Community acquired pneumonia 10/11/2013   Hypertension 10/11/2013   Hypokalemia 10/11/2013   Home Medication(s) Prior to Admission medications   Medication Sig Start Date End Date Taking? Authorizing Provider  alendronate (FOSAMAX) 70 MG tablet Take 70 mg by mouth once a week. Take with a full glass of water on an empty stomach. Take on fridays or saturdays    [provider]  amLODipine (NORVASC) 5 MG tablet Take 5 mg by mouth daily.    [provider]  atenolol-chlorthalidone (TENORETIC) 100-25 MG per tablet Take 1 tablet by mouth daily.    [provider]  atorvastatin (LIPITOR) 20 MG tablet Take 20 mg by mouth daily.    [provider]  chlorpheniramine-HYDROcodone (TUSSIONEX PENNKINETIC ER) 10-8 MG/5ML SUER Take 5 mLs by mouth every 12 (twelve) hours as needed for cough. 12/01/20   [name], MD  doxycycline (VIBRAMYCIN) 100 MG capsule Take 1 capsule (100 mg total) by mouth 2 (two) times daily. 11/28/20    [name], MD  primidone (MYSOLINE) 50 MG tablet Take 50 mg by mouth daily. 03/31/20   [provider]                                                                                                                                    Past Surgical History Past Surgical History:  Procedure Laterality Date   CESAREAN SECTION     Family History History reviewed. No pertinent family history.  Social History Social History   Tobacco Use   Smoking status: Never   Smokeless tobacco: Never  Vaping Use   Vaping Use: Never used  Substance Use Topics   Alcohol use: No   Drug use: No   Allergies Tetanus toxoids  Review of Systems Review of Systems  All other systems reviewed and are negative.   Physical Exam Vital Signs  I have reviewed the triage vital signs BP Marland Kitchen)  112/51   Pulse (!) 55   Temp 99.9 F (37.7 C) (Oral)   Resp 18   Ht '4\' 11"'$  (1.499 m)   Wt 64.9 kg   SpO2 96%   BMI 28.88 kg/m  Physical Exam Vitals and nursing note reviewed.  Constitutional:      General: She is not in acute distress.    Appearance: She is well-developed.  HENT:     Head: Normocephalic and atraumatic.     Mouth/Throat:     Mouth: Mucous membranes are dry.  Eyes:     Pupils: Pupils are equal, round, and reactive to light.  Cardiovascular:     Rate and Rhythm: Normal rate and regular rhythm.     Heart sounds: No murmur heard. Pulmonary:     Effort: Pulmonary effort is normal. No respiratory distress.     Breath sounds: Rales (left mid lung field) present.  Abdominal:     General: Abdomen is flat.     Palpations: Abdomen is soft.     Tenderness: There is no abdominal tenderness.  Musculoskeletal:        General: No tenderness.     Right lower leg: No edema.     Left lower leg: No edema.  Skin:    General: Skin is warm and dry.  Neurological:     General: No focal deficit present.     Mental Status: She is alert. Mental status is at baseline.  Psychiatric:         Mood and Affect: Mood normal.        Behavior: Behavior normal.     ED Results and Treatments Labs (all labs ordered are listed, but only abnormal results are displayed) Labs Reviewed  COMPREHENSIVE METABOLIC PANEL - Abnormal; Notable for the following components:      Result Value   Sodium 134 (*)    Glucose, Bld 110 (*)    BUN 35 (*)    Creatinine, Ser 1.85 (*)    Calcium 8.7 (*)    AST 68 (*)    GFR, Estimated 27 (*)    All other components within normal limits  CULTURE, BLOOD (ROUTINE X 2)  CULTURE, BLOOD (ROUTINE X 2)  RESP PANEL BY RT-PCR (RSV, FLU A&B, COVID)  RVPGX2  CBC WITH DIFFERENTIAL/PLATELET  CBC WITH DIFFERENTIAL/PLATELET  COMPREHENSIVE METABOLIC PANEL  MAGNESIUM  MAGNESIUM  PHOSPHORUS                                                                                                                          Radiology DG Chest 2 View  Result Date: 11/25/2022 CLINICAL DATA:  Shortness of breath and cough. EXAM: CHEST - 2 VIEW COMPARISON:  11/25/2022 and prior studies FINDINGS: This is a mildly low volume film. The cardiomediastinal silhouette is unchanged. LEFT LOWER lobe airspace opacity with possible trace LEFT pleural effusion noted. The RIGHT lung is clear. There is no evidence of pneumothorax or acute bony abnormality. IMPRESSION: 1. LEFT  LOWER lobe airspace opacity compatible with pneumonia. Radiographic follow-up to resolution is recommended. 2. Possible trace LEFT pleural effusion. Electronically Signed   By: Margarette Canada M.D.   On: 11/25/2022 15:40    Pertinent labs & imaging results that were available during my care of the patient were reviewed by me and considered in my medical decision making (see MDM for details).  Medications Ordered in ED Medications  acetaminophen (TYLENOL) tablet 650 mg (has no administration in time range)    Or  acetaminophen (TYLENOL) suppository 650 mg (has no administration in time range)  melatonin tablet 3 mg (has no  administration in time range)  azithromycin (ZITHROMAX) 500 mg in sodium chloride 0.9 % 250 mL IVPB (has no administration in time range)  cefTRIAXone (ROCEPHIN) 1 g in sodium chloride 0.9 % 100 mL IVPB (has no administration in time range)  sodium chloride 0.9 % bolus 1,000 mL (1,000 mLs Intravenous New Bag/Given 11/25/22 1750)  cefTRIAXone (ROCEPHIN) 2 g in sodium chloride 0.9 % 100 mL IVPB (0 g Intravenous Stopped 11/25/22 1901)  azithromycin (ZITHROMAX) 500 mg in sodium chloride 0.9 % 250 mL IVPB ( Intravenous New Bag/Given 11/25/22 1900)                                                                                                                                     Procedures .Critical Care  Performed by: Cristie Hem, MD Authorized by: Cristie Hem, MD   Critical care provider statement:    Critical care time (minutes):  30   Critical care was necessary to treat or prevent imminent or life-threatening deterioration of the following conditions:  Respiratory failure   Critical care was time spent personally by me on the following activities:  Development of treatment plan with patient or surrogate, discussions with consultants, evaluation of patient's response to treatment, examination of patient, ordering and review of laboratory studies, ordering and review of radiographic studies, ordering and performing treatments and interventions, pulse oximetry, re-evaluation of patient's condition and review of old charts   (including critical care time)  Medical Decision Making / ED Course   MDM:  82 year old female presenting to the emergency department with cough.  Patient in no acute distress, lung sounds notable for left mid field crackles.  Chest x-ray does show left lower lobe pneumonia consistent with patient's presentation.  She is hypoxic on room air.  No fever, hypotension.  Will give fluid bolus.  Less likely COVID but will send flu/COVID swab.  Patient denies any  other focal symptoms to suggest intra-abdominal process.  No chest pain, shortness of breath to suggest ACS, PE.  Anticipate admission given hypoxia.    Clinical Course as of 11/25/22 2003  Mon Nov 25, 2022  [id number] Discussed with Dr. Velia Meyer who admitted patient for pneumonaia/.  [WS]    Clinical Course User Index [WS] Cristie Hem, MD     Additional  history obtained: -Additional history obtained from family -External records from outside source obtained and reviewed including: Chart review including previous notes, labs, imaging, consultation notes including PMD note from today   Lab Tests: -I ordered, reviewed, and interpreted labs.   The pertinent results include:   Labs Reviewed  COMPREHENSIVE METABOLIC PANEL - Abnormal; Notable for the following components:      Result Value   Sodium 134 (*)    Glucose, Bld 110 (*)    BUN 35 (*)    Creatinine, Ser 1.85 (*)    Calcium 8.7 (*)    AST 68 (*)    GFR, Estimated 27 (*)    All other components within normal limits  CULTURE, BLOOD (ROUTINE X 2)  CULTURE, BLOOD (ROUTINE X 2)  RESP PANEL BY RT-PCR (RSV, FLU A&B, COVID)  RVPGX2  CBC WITH DIFFERENTIAL/PLATELET  CBC WITH DIFFERENTIAL/PLATELET  COMPREHENSIVE METABOLIC PANEL  MAGNESIUM  MAGNESIUM  PHOSPHORUS     EKG   EKG Interpretation  Date/Time:  Monday November 25 2022 15:46:41 EDT Ventricular Rate:  60 PR Interval:  157 QRS Duration: 94 QT Interval:  413 QTC Calculation: 413 R Axis:   -32 Text Interpretation: Sinus rhythm Abnormal R-wave progression, late transition Left ventricular hypertrophy Confirmed by Garnette Gunner [phone]) on 11/25/2022 4:00:55 PM         Imaging Studies ordered: I ordered imaging studies including CXR On my interpretation imaging demonstrates pneumonia  I independently visualized and interpreted imaging. I agree with the radiologist interpretation   Medicines ordered and prescription drug management: Meds ordered this  encounter  Medications   sodium chloride 0.9 % bolus 1,000 mL   cefTRIAXone (ROCEPHIN) 2 g in sodium chloride 0.9 % 100 mL IVPB    Order Specific Question:   Antibiotic Indication:    Answer:   CAP   azithromycin (ZITHROMAX) 500 mg in sodium chloride 0.9 % 250 mL IVPB   OR Linked Order Group    acetaminophen (TYLENOL) tablet 650 mg    acetaminophen (TYLENOL) suppository 650 mg   melatonin tablet 3 mg   azithromycin (ZITHROMAX) 500 mg in sodium chloride 0.9 % 250 mL IVPB    Order Specific Question:   Antibiotic Indication:    Answer:   CAP   cefTRIAXone (ROCEPHIN) 1 g in sodium chloride 0.9 % 100 mL IVPB    Order Specific Question:   Antibiotic Indication:    Answer:   CAP    -I have reviewed the patients home medicines and have made adjustments as needed   Consultations Obtained: I requested consultation with the hospitalist,  and discussed lab and imaging findings as well as pertinent plan - they recommend: admission   Cardiac Monitoring: The patient was maintained on a cardiac monitor.  I personally viewed and interpreted the cardiac monitored which showed an underlying rhythm of: NSR  Reevaluation: After the interventions noted above, I reevaluated the patient and found that their symptoms have improved  Co morbidities that complicate the patient evaluation  Past Medical History:  Diagnosis Date   Hypertension       Dispostion: Disposition decision including need for hospitalization was considered, and patient discharged from emergency department.    Final Clinical Impression(s) / ED Diagnoses Final diagnoses:  Community acquired pneumonia of left lower lobe of lung  Acute kidney injury ([place])     This chart was dictated using voice recognition software.  Despite best efforts to proofread,  errors can occur which can change the documentation meaning.  Cristie Hem, MD 11/25/22 2003

## 2022-11-25 NOTE — ED Provider Triage Note (Signed)
Emergency Medicine Provider Triage Evaluation Note  Lauren Key , a 82 y.o. female  was evaluated in triage.  Pt complains of cough, congestion, body aches, fevers, shortness of breath.  Symptoms have been present since Thursday of last week and have gotten worse.  Presented to her primary care provider at [facility] and was found to be hypoxic to 85% on room air and sent to the ED for further evaluation.  Denies chest pain, nausea, vomiting  Review of Systems  Positive: As above Negative: As above  Physical Exam  BP (!) 132/46 (BP Location: Left Arm)   Pulse 65   Temp 99.9 F (37.7 C) (Oral)   Resp 16   Ht '4\' 11"'$  (1.499 m)   Wt 64.9 kg   SpO2 91%   BMI 28.88 kg/m  Gen:   Awake, no distress   Resp:  Normal effort, frequent cough MSK:   Moves extremities without difficulty  Other:    Medical Decision Making  Medically screening exam initiated at 3:15 PM.  Appropriate orders placed.  Lauren Key was informed that the remainder of the evaluation will be completed by another provider, this initial triage assessment does not replace that evaluation, and the importance of remaining in the ED until their evaluation is complete.  Hypoxic to 93% on 4 L nasal cannula.  Will be next back to room   Lauren Key, Hershal Coria 11/25/22 1516

## 2022-11-25 NOTE — ED Triage Notes (Signed)
Patient was told she has pneumonia. Has been feeling sick since Thursday. Has a productive cough (white mucus), no appetite. Takes mucinex.

## 2022-11-25 NOTE — H&P (Addendum)
History and Physical      Lauren Key [id number] DOB: [birth year] DOA: 11/25/2022  PCP: Lauren Contras, MD  Patient coming from: home   I have personally briefly reviewed patient's old medical records in [place]  Chief Complaint: Generalized weakness  HPI: Lauren Key is a 82 y.o. female with medical history significant for essential pretension, hyperlipidemia, seizure disorder, who is admitted to [facility] on 11/25/2022 with community-acquired pneumonia after presenting from home to [facility] ED complaining of generalized weakness.   The patient reports 5 days of generalized weakness, starting on Thursday, 11/21/2022, in the absence of any associated acute focal weakness.  She notes associated new onset nonproductive cough over this timeframe, but denies overt shortness of breath.  No associated any hemoptysis, wheezing.  She also notes associated subjective fever, but in the absence of chills, body rigors, generalized myalgias.  Not associate with any new rhinitis, rhinorrhea, sore throat.  Over the last 5 days, she also reports decline in appetite resulting in reduction in oral intake of both food and water over that timeframe.  Not associate any nausea, vomiting, diarrhea, abdominal pain.  She also denies any recent headache, neck stiffness, rash, abdominal pain, dysuria or gross hematuria.  No recent orthopnea, PND, or worsening of peripheral edema.  Denies any known chronic underlying pulmonary pathology, and confirms that she is a lifelong non-smoker.  No known baseline supplemental oxygen requirements.  Medical history notable for chronic diastolic heart failure, with most recent echocardiogram, which was performed in August 2021 notable for LVEF 60 to 65%, no focalization or maladies, grade 1 diastolic dysfunction, normal right ventricular systolic function, trivial mitral regurgitation.  Outpatient diuretic regimen consists of chlorthalidone.     ED Course:   Vital signs in the ED were notable for the following: Dr. [name] 9; heart rate [id number]; systolic blood present in the 1 teens to 130s; respiratory rate 16-23, oxygen saturation initially noted to be 88 to 89% on room air, Sosan improving to the range of 96 to 97% 2 L milligram of  Labs were notable for the following: CMP notable for the following: Potassium 4 2, Cardide 23, creatinine 1.5 number demonstration prior serum creatinine data point of 0.5 in January 2015, glucose 1 [place] liver enzymes within limits.  CBC notable for white cell count 7300, hemoglobin 14.3.  Platelets x 2 collected prior to initiation of IV antibiotics.  COVID, influenza, RSV been ordered, with result currently pending.  Per my interpretation, EKG in ED demonstrated the following: EKG shows sinus rhythm with single PVC, heart rate 60, normal intervals, no evidence of ST or T wave changes, including no evidence of ST elevation.  2 view chest x-ray, per formal radiology read shows left lower lobe airspace Pacitti consistent with pneumonia, with potential trace left lower effusion, without any overt evidence of edema or pneumothorax.  While in the ED, the following were administered: Azithromycin, Rocephin, normal saline x 1 L bolus.  Subsequently, the patient was admitted for further evaluation management of presenting left-sided community-acquired pneumonia complicated by acute hypoxic respiratory distress, acute kidney injury after presenting with generalized weakness.    Review of Systems: As per HPI otherwise 10 point review of systems negative.   Past Medical History:  Diagnosis Date   Hypertension     Past Surgical History:  Procedure Laterality Date   CESAREAN SECTION      Social History:  reports that she has never smoked. She has never  used smokeless tobacco. She reports that she does not drink alcohol and does not use drugs.   Allergies  Allergen Reactions   Tetanus Toxoids     Could  not breath     History reviewed. No pertinent family history.  Family history reviewed and not pertinent    Prior to Admission medications   Medication Sig Start Date End Date Taking? Authorizing Provider  alendronate (FOSAMAX) 70 MG tablet Take 70 mg by mouth once a week. Take with a full glass of water on an empty stomach. Take on fridays or saturdays    [provider]  amLODipine (NORVASC) 5 MG tablet Take 5 mg by mouth daily.    [provider]  atenolol-chlorthalidone (TENORETIC) 100-25 MG per tablet Take 1 tablet by mouth daily.    [provider]  atorvastatin (LIPITOR) 20 MG tablet Take 20 mg by mouth daily.    [provider]  chlorpheniramine-HYDROcodone (TUSSIONEX PENNKINETIC ER) 10-8 MG/5ML SUER Take 5 mLs by mouth every 12 (twelve) hours as needed for cough. 12/01/20   Lauren Pence, MD  doxycycline (VIBRAMYCIN) 100 MG capsule Take 1 capsule (100 mg total) by mouth 2 (two) times daily. 11/28/20   Lauren Gambler, MD  primidone (MYSOLINE) 50 MG tablet Take 50 mg by mouth daily. 03/31/20   [provider]     Objective    Physical Exam: Vitals:   11/25/22 1600 11/25/22 1700 11/25/22 1800 11/25/22 1900  BP: 118/74 (!) 116/57 (!) 121/56 (!) 112/51  Pulse: (!) 59 (!) 58 (!) 59 (!) 55  Resp: 16 (!) 21 (!) 23 18  Temp:      TempSrc:      SpO2: 93% 96% 97% 96%  Weight:      Height:        General: appears to be stated age; alert, oriented Skin: warm, dry, no rash Head:  AT/[place] Mouth:  Oral mucosa membranes appear dry, normal dentition Neck: supple; trachea midline Heart:  RRR; did not appreciate any M/R/G Lungs: Left lower lobe Rales, but otherwise, did not appreciate any wheezes, or rhonchi Abdomen: + BS; soft, ND, NT Vascular: 2+ pedal pulses b/l; 2+ radial pulses b/l Extremities: no peripheral edema, no muscle wasting Neuro: strength and sensation intact in upper and lower extremities b/l     Labs on Admission: I  have personally reviewed following labs and imaging studies  CBC: Recent Labs  Lab 11/25/22 1725  WBC 7.3  NEUTROABS 4.8  HGB 14.3  HCT 43.5  MCV 98.2  PLT [id number]   Basic Metabolic Panel: Recent Labs  Lab 11/25/22 1725  NA 134*  K 4.2  CL 99  CO2 23  GLUCOSE 110*  BUN 35*  CREATININE 1.85*  CALCIUM 8.7*   GFR: Estimated Creatinine Clearance: 19.5 mL/min (A) (by C-G formula based on SCr of 1.85 mg/dL (H)). Liver Function Tests: Recent Labs  Lab 11/25/22 1725  AST 68*  ALT 40  ALKPHOS 98  BILITOT 0.9  PROT 8.1  ALBUMIN 4.0   No results for input(s): "LIPASE", "AMYLASE" in the last 168 hours. No results for input(s): "AMMONIA" in the last 168 hours. Coagulation Profile: No results for input(s): "INR", "PROTIME" in the last 168 hours. Cardiac Enzymes: No results for input(s): "CKTOTAL", "CKMB", "CKMBINDEX", "TROPONINI" in the last 168 hours. BNP (last 3 results) No results for input(s): "PROBNP" in the last 8760 hours. HbA1C: No results for input(s): "HGBA1C" in the last 72 hours. CBG: No results for input(s): "GLUCAP"  in the last 168 hours. Lipid Profile: No results for input(s): "CHOL", "HDL", "LDLCALC", "TRIG", "CHOLHDL", "LDLDIRECT" in the last 72 hours. Thyroid Function Tests: No results for input(s): "TSH", "T4TOTAL", "FREET4", "T3FREE", "THYROIDAB" in the last 72 hours. Anemia Panel: No results for input(s): "VITAMINB12", "FOLATE", "FERRITIN", "TIBC", "IRON", "RETICCTPCT" in the last 72 hours. Urine analysis: No results found for: "COLORURINE", "APPEARANCEUR", "LABSPEC", "PHURINE", "GLUCOSEU", "HGBUR", "BILIRUBINUR", "KETONESUR", "PROTEINUR", "UROBILINOGEN", "NITRITE", "LEUKOCYTESUR"  Radiological Exams on Admission: DG Chest 2 View  Result Date: 11/25/2022 CLINICAL DATA:  Shortness of breath and cough. EXAM: CHEST - 2 VIEW COMPARISON:  11/25/2022 and prior studies FINDINGS: This is a mildly low volume film. The cardiomediastinal silhouette is unchanged.  LEFT LOWER lobe airspace opacity with possible trace LEFT pleural effusion noted. The RIGHT lung is clear. There is no evidence of pneumothorax or acute bony abnormality. IMPRESSION: 1. LEFT LOWER lobe airspace opacity compatible with pneumonia. Radiographic follow-up to resolution is recommended. 2. Possible trace LEFT pleural effusion. Electronically Signed   By: Margarette Canada M.D.   On: 11/25/2022 15:40      Assessment/Plan    Principal Problem:   CAP (community acquired pneumonia) Active Problems:   Essential hypertension   AKI (acute kidney injury) ([place])   Generalized weakness   Acute hypoxic respiratory failure (HCC)   Hyperlipidemia   History of seizure disorder       #) Community-acquired pneumonia: Diagnosis on the basis of 5 days of new onset cough, subjective fever, generalized weakness, with present chest x-ray showing evidence of left lower lobe airspace opacity consistent with pneumonia.  Of note, in the absence of objective fever or leukocytosis, SIRS criteria are not met for sepsis at this time.  COVID, RSV, influenza PCR all pending.  I presume to have been stable.  Blood cultures x 2 collected in the ED today followed by initiation of azithromycin and Rocephin, which will be continued as coverage for community-acquired pneumonia.  No evidence of additional underlying infection at this time, but will also check urinalysis to further assess.  Plan: lactated Ringer's 75 cc/h x 12 hours.  Follow-up results with ultrasound to.  Follow result of COVID, influenza, RSV PCR.  Continue azithromycin, Rocephin.  Procalcitonin level, urinalysis.  Incentive [place], flutter valve.  Check strep pneumonia urine antigen.  As needed acetaminophen for fever.  Repeat CMP, CBC in the morning.  Check serum magnesium and phosphorus levels.  Prn Tessalon Perles for cough.            #) Acute hypoxic respiratory distress: in the context of acute respiratory symptoms and no known  baseline supplemental O2 requirements, presenting O2 sat note to be in the high 80s on room air, Sosan improving into the mid 90s on 2 L nasal cannula, as further quantified above, thereby meeting criteria for acute hypoxic respiratory distress as opposed to acute hypoxic respiratory failure at this time. Appears to be on basis of presenting community-acquired pneumonia. Presenting CXR shows left lower lobe infiltrate consistent with pneumonia, in the absence of any additional acute cardiopulmonary process, including no evidence of edema or pneumothorax.  No known chronic underlying pulmonary conditions .  In terms of other considered etiologies, ACS appears less likely at this time in the absence of any recent CP and in the context of presenting EKG showing no e/o acute ischemic process. No clinical or radiographic evidence to suggest acutely decompensated heart failure at this time. Clinically, presentation is less suggestive of acute PE at this time. COVID-19/Influenza/RSV PCR  are pending.    Plan: further evaluation/management of presenting community-acquired pneumonia, as above. Monitor continuous pulse ox with prn supplemental O2 to maintain O2 sats greater than or equal to 92%. monitor on telemetry. CMP/CBC in the AM. Check serum Mg and Phos levels. Check blood gas. Flutter valve, incentive spirometry.  Add on BNP, procalcitonin level.  Follow-up results of COVID, RSV, influenza PCR, as above.               #) Acute Kidney Injury:  as quantified above, presumed to represent an acute injury, although does acknowledge that there is approximately 9-year did point gap between available serum creatinine results.  Suspect that presenting interval increase in serum creatinine represents an acute kidney injury, likely with prerenal elements, including that of dehydration, given report of recent decline in oral intake over the last 4 to 5 days, as above, with potential additional prerenal  contribution from diminished renal perfusion as consequence of diminished oxygen delivery capacity resultant from presenting acute hypoxic respiratory distress, as above   Plan: monitor strict I's & O's and daily weights. Attempt to avoid nephrotoxic agents.  Hold home chlorthalidone.  Refrain from NSAIDs. Repeat CMP in the morning. Check serum magnesium level.  Check urinalysis with microscopy.  Add-on random urine sodium and random urine creatinine.  In the context of concomitant generalized weakness, will also add on CPK level.  IV fluids, as above.            #) Generalized weakness: 4 to 5-day duration of generalized weakness, in the absence of any evidence of acute focal neurologic deficits, including no evidence of acute focal weakness to suggest acute CVA.  Suspect contribution from physiologic stress stemming from presenting community-acquired pneumonia.  Will also check urinalysis to evaluate for any additional infectious contributions.  Suspected distributions from relative dehydration with resultant acute kidney injury, as further detailed above.    Will further eval for any additional contributions from endocrine/metabolic sources, as detailed below.     Plan: work-up and management of presenting community-acquired pneumonia, as described above. PT/OT consults ordered for the AM. Fall precautions. CMP/CBC in the AM. Check TSH, serum Mg level. Check CPK level in the setting of concomitant acute kidney injury.  Check urinalysis.  LR, as above.               #) Essential Hypertension: documented h/o such, with outpatient antihypertensive regimen including chlorthalidone, atenolol, Norvasc.  SBP's in the ED today: 1 teens to 130s mmHg. in the setting of clinical evidence of mild dehydration, will hold home chlorthalidone for now.  Additionally, in the setting of presenting underlying infection, will also hold atenolol and Norvasc for now.  Hold  Plan: Close monitoring of  subsequent BP via routine VS. home antihypertensive regimen for now, as above.              #) Hyperlipidemia: documented h/o such. On Lipitor as outpatient.   Plan: continue home statin.              #) History of seizures: Documented history of such, without clinical evidence to suggest active seizures at this time.  Outpatient antiepileptic regimen consists of: Primidone.    Plan: Continue outpatient antiepileptic regimen.               #) Chronic diastolic heart failure: documented history of such, with most recent echocardiogram performed in August thousand 21, which is notable for LVEF 60 to 65%, as well as grade 1  diastolic dysfunction, with additional results as conveyed above. No clinical or radiographic evidence to suggest acutely decompensated heart failure at this time. home diuretic regimen reportedly consists of the following: Chlorthalidone, which will be held for now, given clinical evidence of mild dehydration.   Plan: monitor strict I's & O's and daily weights. Repeat CMP in AM. Check serum mag level.  Chlorthalidone for now, as above.  Add on BMP.       DVT prophylaxis: SCD's inpatient  Code Status: Full code Family Communication: none Disposition Plan: Per Rounding Team Consults called: none;  Admission status: Inpatient    I SPENT GREATER THAN 75  MINUTES IN CLINICAL CARE TIME/MEDICAL DECISION-MAKING IN COMPLETING THIS ADMISSION.     [place] DO [facility] [place]   11/25/2022, 8:13 PM

## 2022-11-26 DIAGNOSIS — J189 Pneumonia, unspecified organism: Secondary | ICD-10-CM | POA: Diagnosis not present

## 2022-11-26 DIAGNOSIS — J9601 Acute respiratory failure with hypoxia: Secondary | ICD-10-CM | POA: Diagnosis not present

## 2022-11-26 DIAGNOSIS — I10 Essential (primary) hypertension: Secondary | ICD-10-CM | POA: Diagnosis not present

## 2022-11-26 DIAGNOSIS — N179 Acute kidney failure, unspecified: Secondary | ICD-10-CM | POA: Diagnosis not present

## 2022-11-26 LAB — COMPREHENSIVE METABOLIC PANEL
ALT: 33 U/L (ref 0–44)
AST: 56 U/L — ABNORMAL HIGH (ref 15–41)
Albumin: 3.6 g/dL (ref 3.5–5.0)
Alkaline Phosphatase: 87 U/L (ref 38–126)
Anion gap: 12 (ref 5–15)
BUN: 42 mg/dL — ABNORMAL HIGH (ref 8–23)
CO2: 22 mmol/L (ref 22–32)
Calcium: 8.1 mg/dL — ABNORMAL LOW (ref 8.9–10.3)
Chloride: 101 mmol/L (ref 98–111)
Creatinine, Ser: 1.63 mg/dL — ABNORMAL HIGH (ref 0.44–1.00)
GFR, Estimated: 31 mL/min — ABNORMAL LOW (ref >60)
Glucose, Bld: 96 mg/dL (ref 70–99)
Potassium: 4 mmol/L (ref 3.5–5.1)
Sodium: 135 mmol/L (ref 135–145)
Total Bilirubin: 0.8 mg/dL (ref 0.3–1.2)
Total Protein: 7.3 g/dL (ref 6.5–8.1)

## 2022-11-26 LAB — CBC WITH DIFFERENTIAL/PLATELET
Abs Immature Granulocytes: 0.01 10*3/uL (ref 0.00–0.07)
Basophils Absolute: 0 10*3/uL (ref 0.0–0.1)
Basophils Relative: 1 %
Eosinophils Absolute: 0 10*3/uL (ref 0.0–0.5)
Eosinophils Relative: 1 %
HCT: 38.9 % (ref 36.0–46.0)
Hemoglobin: 12.7 g/dL (ref 12.0–15.0)
Immature Granulocytes: 0 %
Lymphocytes Relative: 40 %
Lymphs Abs: 2.4 10*3/uL (ref 0.7–4.0)
MCH: 32.2 pg (ref 26.0–34.0)
MCHC: 32.6 g/dL (ref 30.0–36.0)
MCV: 98.7 fL (ref 80.0–100.0)
Monocytes Absolute: 0.8 10*3/uL (ref 0.1–1.0)
Monocytes Relative: 13 %
Neutro Abs: 2.8 10*3/uL (ref 1.7–7.7)
Neutrophils Relative %: 45 %
Platelets: 181 10*3/uL (ref 150–400)
RBC: 3.94 MIL/uL (ref 3.87–5.11)
RDW: 12 % (ref 11.5–15.5)
WBC: 6.1 10*3/uL (ref 4.0–10.5)
nRBC: 0 % (ref 0.0–0.2)

## 2022-11-26 LAB — PHOSPHORUS: Phosphorus: 4.4 mg/dL (ref 2.5–4.6)

## 2022-11-26 LAB — BLOOD GAS, VENOUS
Acid-base deficit: 2.5 mmol/L — ABNORMAL HIGH (ref 0.0–2.0)
Bicarbonate: 24.2 mmol/L (ref 20.0–28.0)
O2 Saturation: 41.5 %
Patient temperature: 37
pCO2, Ven: 48 mmHg (ref 44–60)
pH, Ven: 7.31 (ref 7.25–7.43)
pO2, Ven: <31 mmHg — CL (ref 32–45)

## 2022-11-26 LAB — MAGNESIUM: Magnesium: 2.1 mg/dL (ref 1.7–2.4)

## 2022-11-26 MED ORDER — IPRATROPIUM BROMIDE 0.02 % IN SOLN
0.5000 mg | Freq: Four times a day (QID) | RESPIRATORY_TRACT | Status: DC
  Administered 2022-11-26: 0.5 mg via RESPIRATORY_TRACT
  Filled 2022-11-26: qty 2.5

## 2022-11-26 MED ORDER — AMLODIPINE BESYLATE 10 MG PO TABS
10.0000 mg | ORAL_TABLET | Freq: Every day | ORAL | Status: DC
  Administered 2022-11-26 – 2022-11-28 (×3): 10 mg via ORAL
  Filled 2022-11-26 (×3): qty 1

## 2022-11-26 MED ORDER — LACTATED RINGERS IV SOLN: INTRAVENOUS | Status: AC

## 2022-11-26 MED ORDER — ATENOLOL 25 MG PO TABS
50.0000 mg | ORAL_TABLET | Freq: Every day | ORAL | Status: DC
  Administered 2022-11-26 – 2022-11-28 (×3): 50 mg via ORAL
  Filled 2022-11-26 (×3): qty 2

## 2022-11-26 MED ORDER — IPRATROPIUM BROMIDE 0.02 % IN SOLN
0.5000 mg | Freq: Three times a day (TID) | RESPIRATORY_TRACT | Status: DC
  Administered 2022-11-27: 0.5 mg via RESPIRATORY_TRACT
  Filled 2022-11-26: qty 2.5

## 2022-11-26 MED ORDER — LEVALBUTEROL HCL 0.63 MG/3ML IN NEBU
0.6300 mg | INHALATION_SOLUTION | Freq: Four times a day (QID) | RESPIRATORY_TRACT | Status: DC
  Administered 2022-11-26: 0.63 mg via RESPIRATORY_TRACT
  Filled 2022-11-26: qty 3

## 2022-11-26 MED ORDER — GUAIFENESIN ER 600 MG PO TB12
1200.0000 mg | ORAL_TABLET | Freq: Two times a day (BID) | ORAL | Status: DC
  Administered 2022-11-26 – 2022-11-28 (×4): 1200 mg via ORAL
  Filled 2022-11-26 (×4): qty 2

## 2022-11-26 MED ORDER — ATENOLOL 25 MG PO TABS: 100.0000 mg | ORAL_TABLET | Freq: Every day | ORAL | Status: DC

## 2022-11-26 MED ORDER — LEVALBUTEROL HCL 0.63 MG/3ML IN NEBU
0.6300 mg | INHALATION_SOLUTION | Freq: Three times a day (TID) | RESPIRATORY_TRACT | Status: DC
  Administered 2022-11-27: 0.63 mg via RESPIRATORY_TRACT
  Filled 2022-11-26: qty 3

## 2022-11-26 NOTE — Progress Notes (Signed)
Mobility Specialist - Progress Note   11/26/22 1525  Mobility  Activity Ambulated with assistance in hallway;Ambulated with assistance to bathroom  Level of Assistance Contact guard assist, steadying assist  Assistive Device None  Distance Ambulated (ft) 220 ft  Activity Response Tolerated well  Mobility Referral Yes  $Mobility charge 1 Mobility   Pt received in recliner and agreeable to mobility. Pt voiced not wanting to use walker this afternoon even though given the suggestion to utilize walker. Once standing pt was holding on to sink for balance support. Suggested pt use the walker again, but pt refused. Due to unsteadiness pt was contact guard. Pt had 1x occurrence of LOB when coming back to room that was corrected w/ gait belt. Once to room, pt requested assistance to bathroom. No complaints during session. Pt did have a coughing spell w/ no output of sputum at EOS but stated she was ok. Pt to recliner after session with all needs met & call bell in reach.   Pre-mobility: 67 HR, 92% SpO2 During mobility: 74 HR, 90% SpO2 Post-mobility: 62 HR, 89% SPO2    Set designer

## 2022-11-26 NOTE — Evaluation (Signed)
Physical Therapy Evaluation Patient Details Name: Lauren Key MRN: [id number] DOB: 02-10-1941 Today's Date: 11/26/2022  History of Present Illness  Lauren Key is a 82 y.o. female admitted to [facility] on 11/25/2022 with community-acquired pneumonia. PHMx: essential hypertension, hyperlipidemia, seizure disorder,chronic diastolic heart failure  Clinical Impression  Pt admitted with above diagnosis. Pt from home, ind at baseline without AD, driving, lives with spouse. Pt currently mobilizing with supv/min guard, ambulating without an AD, cues for pursed lip breathing. Pt desats on RA, improves to 92% with 1L O2- notified RN. Pt tolerates remaining up in recliner, educated on time OOB and pt verbalizes agreement. Anticipate no f/u PT at discharge. Pt currently with functional limitations due to the deficits listed below (see PT Problem List). Pt will benefit from skilled PT to increase their independence and safety with mobility to allow discharge to the venue listed below.          Recommendations for follow up therapy are one component of a multi-disciplinary discharge planning process, led by the attending physician.  Recommendations may be updated based on patient status, additional functional criteria and insurance authorization.  Follow Up Recommendations No PT follow up      Assistance Recommended at Discharge PRN  Patient can return home with the following  Assistance with cooking/housework;Assist for transportation;Help with stairs or ramp for entrance    Equipment Recommendations None recommended by PT  Recommendations for Other Services       Functional Status Assessment Patient has had a recent decline in their functional status and demonstrates the ability to make significant improvements in function in a reasonable and predictable amount of time.     Precautions / Restrictions Precautions Precautions: Fall Precaution Comments: monitor  O2 Restrictions Weight Bearing Restrictions: No      Mobility  Bed Mobility Overal bed mobility: Modified Independent  General bed mobility comments: increased time    Transfers Overall transfer level: Needs assistance Equipment used: None Transfers: Sit to/from Stand Sit to Stand: Supervision  General transfer comment: BUE assisting to power up    Ambulation/Gait Ambulation/Gait assistance: Min guard, Supervision Gait Distance (Feet): 160 Feet Assistive device: None Gait Pattern/deviations: Step-through pattern, Decreased stride length Gait velocity: decreased  General Gait Details: step through gait pattern, no overt LOB, increased lateral weight shifting, increased time/steps with turns, on RA and cued for pursed lip breathing  Stairs            Wheelchair Mobility    Modified Rankin (Stroke Patients Only)       Balance Overall balance assessment: Mild deficits observed, not formally tested         Pertinent Vitals/Pain Pain Assessment Pain Assessment: No/denies pain    Home Living Family/patient expects to be discharged to:: Private residence Living Arrangements: Spouse/significant other Available Help at Discharge: Family;Available 24 hours/day Type of Home: House Home Access: Stairs to enter Entrance Stairs-Rails: Right Entrance Stairs-Number of Steps: 4   Home Layout: One level Home Equipment: Cane - single point      Prior Function Prior Level of Function : Independent/Modified Independent;Driving  Mobility Comments: pt reports ind without AD ADLs Comments: pt reports ind with ADLs and IADLs     Hand Dominance   Dominant Hand: Right    Extremity/Trunk Assessment   Upper Extremity Assessment Upper Extremity Assessment: Overall WFL for tasks assessed    Lower Extremity Assessment Lower Extremity Assessment: Overall WFL for tasks assessed (AROM WFL, strength grossly 4-/5, denies numbness/tingling)  Cervical / Trunk  Assessment Cervical / Trunk Assessment: Normal  Communication   Communication: No difficulties  Cognition Arousal/Alertness: Awake/alert Behavior During Therapy: WFL for tasks assessed/performed Overall Cognitive Status: Within Functional Limits for tasks assessed     General Comments General comments (skin integrity, edema, etc.): sats as low as 88% on RA, was able to get up to 92% when placed on 1 liter, 93% when she used flutter valve, and 94% when she used the inspirometer    Exercises     Assessment/Plan    PT Assessment Patient needs continued PT services  PT Problem List Decreased activity tolerance;Decreased balance;Decreased knowledge of use of DME;Decreased knowledge of precautions;Cardiopulmonary status limiting activity       PT Treatment Interventions DME instruction;Gait training;Functional mobility training;Therapeutic activities;Therapeutic exercise;Balance training;Neuromuscular re-education;Patient/family education    PT Goals (Current goals can be found in the Care Plan section)  Acute Rehab PT Goals Patient Stated Goal: regain strength PT Goal Formulation: With patient Time For Goal Achievement: 12/10/22 Potential to Achieve Goals: Good    Frequency Min 3X/week     Co-evaluation PT/OT/SLP Co-Evaluation/Treatment: Yes Reason for Co-Treatment: To address functional/ADL transfers PT goals addressed during session: Mobility/safety with mobility;Balance;Proper use of DME OT goals addressed during session: ADL's and self-care;Strengthening/ROM       AM-PAC PT "6 Clicks" Mobility  Outcome Measure Help needed turning from your back to your side while in a flat bed without using bedrails?: None Help needed moving from lying on your back to sitting on the side of a flat bed without using bedrails?: None Help needed moving to and from a bed to a chair (including a wheelchair)?: A Little Help needed standing up from a chair using your arms (e.g., wheelchair or  bedside chair)?: A Little Help needed to walk in hospital room?: A Little Help needed climbing 3-5 steps with a railing? : A Little 6 Click Score: 20    End of Session Equipment Utilized During Treatment: Gait belt;Oxygen Activity Tolerance: Patient tolerated treatment well Patient left: in chair;with call bell/phone within reach;with chair alarm set Nurse Communication: Mobility status PT Visit Diagnosis: Other abnormalities of gait and mobility (R26.89)    Time: 1355-1420 PT Time Calculation (min) (ACUTE ONLY): 25 min   Charges:   PT Evaluation $PT Eval Low Complexity: 1 Low           Tori Brittinie Wherley PT, DPT 11/26/22, 3:27 PM

## 2022-11-26 NOTE — Evaluation (Signed)
Occupational Therapy Evaluation Patient Details Name: Lauren Key MRN: [id number] DOB: 08/22/1941 Today's Date: 11/26/2022   History of Present Illness Lauren Key is a 82 y.o. female admitted to [facility] on 11/25/2022 with community-acquired pneumonia. PHMx: essential hypertension, hyperlipidemia, seizure disorder,chronic diastolic heart failure   Clinical Impression   This 82 yo female admitted with above presents to acute OT with PLOF of being completely independent with basic ADLS, IADLs, driving, and staying busy. She currently is at an overall S level and has decreased endurance from her baseline for activity. She will continue to benefit from acute OT without need for follow up.      Recommendations for follow up therapy are one component of a multi-disciplinary discharge planning process, led by the attending physician.  Recommendations may be updated based on patient status, additional functional criteria and insurance authorization.   Follow Up Recommendations  No OT follow up     Assistance Recommended at Discharge PRN  Patient can return home with the following Assistance with cooking/housework;Help with stairs or ramp for entrance    Functional Status Assessment  Patient has had a recent decline in their functional status and demonstrates the ability to make significant improvements in function in a reasonable and predictable amount of time.  Equipment Recommendations  None recommended by OT       Precautions / Restrictions Precautions Precautions: Fall Precaution Comments: monitor O2 Restrictions Weight Bearing Restrictions: No      Mobility Bed Mobility Overal bed mobility: Modified Independent             General bed mobility comments: increased time    Transfers Overall transfer level: Needs assistance Equipment used: None Transfers: Sit to/from Stand Sit to Stand: Supervision                  Balance Overall balance  assessment: Mild deficits observed, not formally tested                                         ADL either performed or assessed with clinical judgement   ADL                                         General ADL Comments: Overall at a S level without AD, instructed and had patient practice 5 reps of flutter valve and inspirometer. Recommended she use one on the 1/2 hour and the other on the hour for every hour that she is awake     Vision Baseline Vision/History: 1 Wears glasses Ability to See in Adequate Light: 0 Adequate Patient Visual Report: No change from baseline              Pertinent Vitals/Pain Pain Assessment Pain Assessment: No/denies pain     Hand Dominance Right   Extremity/Trunk Assessment Upper Extremity Assessment Upper Extremity Assessment: Overall WFL for tasks assessed   Lower Extremity Assessment Lower Extremity Assessment: Overall WFL for tasks assessed (AROM WFL, strength grossly 4-/5, denies numbness/tingling)   Cervical / Trunk Assessment Cervical / Trunk Assessment: Normal   Communication Communication Communication: No difficulties   Cognition Arousal/Alertness: Awake/alert Behavior During Therapy: WFL for tasks assessed/performed Overall Cognitive Status: Within Functional Limits for tasks assessed  General Comments  sats as low as 88% on RA, was able to get up to 92% when placed on 1 liter, 93% when she used flutter valve, and 94% when she used the inspirometer.Encouraged her to continue to use these            Home Living Family/patient expects to be discharged to:: Private residence Living Arrangements: Spouse/significant other Available Help at Discharge: Family;Available 24 hours/day Type of Home: House Home Access: Stairs to enter [employer] of Steps: 4 Entrance Stairs-Rails: Right Home Layout: One level     Bathroom  Shower/Tub: Corporate investment banker: Standard     Home Equipment: Cane - single point          Prior Functioning/Environment Prior Level of Function : Independent/Modified Independent;Driving             Mobility Comments: pt reports ind without AD ADLs Comments: pt reports ind with ADLs and IADLs        OT Problem List: Impaired balance (sitting and/or standing);Cardiopulmonary status limiting activity      OT Treatment/Interventions: Energy conservation    OT Goals(Current goals can be found in the care plan section) Acute Rehab OT Goals Patient Stated Goal: to feel and breath better OT Goal Formulation: With patient Time For Goal Achievement: 12/10/22 Potential to Achieve Goals: Good  OT Frequency: Min 2X/week    Co-evaluation PT/OT/SLP Co-Evaluation/Treatment: Yes Reason for Co-Treatment: To address functional/ADL transfers PT goals addressed during session: Mobility/safety with mobility;Balance;Proper use of DME OT goals addressed during session: ADL's and self-care;Strengthening/ROM      AM-PAC OT "6 Clicks" Daily Activity     Outcome Measure Help from another person eating meals?: None Help from another person taking care of personal grooming?: A Little Help from another person toileting, which includes using toliet, bedpan, or urinal?: A Little Help from another person bathing (including washing, rinsing, drying)?: A Little Help from another person to put on and taking off regular upper body clothing?: A Little Help from another person to put on and taking off regular lower body clothing?: A Little 6 Click Score: 19   End of Session Equipment Utilized During Treatment: Gait belt  Activity Tolerance: Patient tolerated treatment well Patient left: in chair;with call bell/phone within reach;with chair alarm set  OT Visit Diagnosis: Unsteadiness on feet (R26.81);Muscle weakness (generalized) (M62.81)                Time: [id number] OT  Time Calculation (min): 27 min Charges:  OT General Charges $OT Visit: 1 Visit OT Evaluation $OT Eval Moderate Complexity: 1 [place] Office [phone]    Almon Register 11/26/2022, 3:21 PM

## 2022-11-26 NOTE — Progress Notes (Signed)
Date and time results received: 11/26/22 0530 (use smartphrase ".now" to insert current time)  Test: Po2 Critical Value: <31  Name of Provider Notified: Olena Heckle  Orders Received? Or Actions Taken?: pending

## 2022-11-26 NOTE — Progress Notes (Signed)
PROGRESS NOTE    Lauren Key  [id number] DOB: 07-29-1941 DOA: 11/25/2022 PCP: Antony Contras, MD   Brief Narrative:  Patient is an 82 year old elderly overweight Caucasian female with a past medical history significant for vomiting to essential hypertension, hyperlipidemia, seizure disorder as well as other comorbidities who presented with generalized weakness as well as a new onset nonproductive cough with no overt shortness of breath.  She notes that she developed generalized weakness about 5 days ago which progressively started worsening.  She has had no hemoptysis or wheezing but did note a subjective fever but no chills or bodyaches or rigors.  Over the last 5 days she reports a decline in her appetite and reduction in her oral intake of both food and water over that timeframe.  She presented with generalized weakness and further workup was done in the [facility] ED and she was found to be hypoxemic and placed on supplemental oxygen.  Further workup showed a pneumonia on chest x-ray and she was placed on CAP coverage.  She was then admitted for further evaluation and management of her left-sided community-acquired pneumonia with hypoxemia as well as AKI and generalized weakness.  Assessment and Plan: No notes have been filed under this hospital service. Service: Hospitalist  Hypoxemia in the setting of left-sided community-acquired pneumonia, present on admission -She presents with a new onset nonproductive cough and generalized weakness with no hemoptysis, shortness of breath or overt wheezing -She did have a subjective temperature but no chills or body rigors -Has had a decline in her oral intake both food and water with no nausea vomiting or diarrhea -SpO2: 92 % O2 Flow Rate (L/min): 1 L/min; she is noted to have O2 saturation in the high 80s on room air and placed on 2 L of supplemental oxygen nasal cannula with improvement to the mid 90s -Chest x-ray done and shows  left lower lobe airspace opacity consistent with pneumonia with potential trace blood pleural effusion but no overt edema or pneumothorax -Given a 1 L normal saline bolus and placed on IV antibiotics with azithromycin and Rocephin which will be continued -She was given lactated Ringer's IV fluid and maintenance at 75 MLS per hour but this time doubt but will renew for another 12 hours -Influenza A/B, RSV and SARS-CoV-2 negative -Will add Xopenex and Atrovent scheduled every 6 as well as flutter valve, incentive spirometry as well as guaifenesin 1200 g p.o. twice daily -Blood cultures x 2 pending -Procalcitonin level 0.28 -Continuous pulse oximetry maintain O2 saturation greater 90% -Continue supplemental oxygen via nasal cannula wean O2 as tolerated  Chronic Diastolic CHF -Currently not in exacerbation -Class echo done and showed an EF of 60 to 65% with grade 1 diastolic dysfunction, normal right ventricular systolic function as well as trivial mitral regurgitation -BNP was 33.6 -Strict I's and O's and daily weights as patient is getting rehydrated with lactated Ringer bolus -Holding home chlorthalidone -Continue monitor for signs and symptoms of volume overload  Generalized Weakness -She has had 4 to 5 days of generalized weakness in the absence of any acute neurological focal deficits -UA not done on admission so we will order -CK was 172 and TSH was 2.910 -Will resume IV fluid hydration with lactated Ringer's at 75 MLS per hour for another 12 more hours total of 24 hours -PT OT evaluated and recommending no follow-up  Essential Hypertension -She takes chlorthalidone atenolol and Norvasc in outpatient setting -Will hold her chlorthalidone and resume Atenolol 50 mg  p.o. daily and Amlodipine 10 mg p.o. daily -Continue monitor blood pressures per protocol -Last blood pressure reading was 146/51  Hyperlipidemia -Continue with Atorvastatin 20 mg p.o. daily but may need to hold if LFTs  continue to worsen  AKI on CKD Stage 3b -BUN/Cr Trend: Recent Labs  Lab 11/25/22 1725 11/26/22 0511  BUN 35* 42*  CREATININE 1.85* 1.63*  -Avoid Nephrotoxic Medications, Contrast Dyes, Hypotension and Dehydration to Ensure Adequate Renal Perfusion and will need to Renally Adjust Meds -Continue to Monitor and Trend Renal Function carefully and repeat CMP in the AM   History of Seizures -Continue seizure precautions and outpatient antiepileptic regimen of primidone 50 mg nightly  Elevated AST -AST Trend: Recent Labs  Lab 11/25/22 1725 11/26/22 0511  AST 68* 56*  -Continue to Monitor and Trend and if necessary we will obtain a right upper quadrant ultrasound as well as an acute hepatitis panel  -Repeat CMP in the AM   Overweight -Complicates overall prognosis and care -Estimated body mass index is 28.88 kg/m as calculated from the following:   Height as of this encounter: '4\' 11"'$  (1.499 m).   Weight as of this encounter: 64.9 kg.  -Weight Loss and Dietary Counseling given   DVT prophylaxis: SCDs Start: 11/25/22 1956    Code Status: Full Code Family Communication: No family present at bedside  Disposition Plan:  Level of care: Telemetry Status is: Inpatient Remains inpatient appropriate because: Needs further clinical improvement and repeat chest x-ray in the a.m. and she will need an amatory home O2 screen prior to discharge   Consultants:  None  Procedures:  As delineated as above  Antimicrobials:  Anti-infectives (From admission, onward)    Start     Dose/Rate Route Frequency Ordered Stop   11/26/22 1900  azithromycin (ZITHROMAX) 500 mg in sodium chloride 0.9 % 250 mL IVPB        500 mg 250 mL/hr over 60 Minutes Intravenous Every 24 hours 11/25/22 1957     11/26/22 1800  cefTRIAXone (ROCEPHIN) 1 g in sodium chloride 0.9 % 100 mL IVPB        1 g 200 mL/hr over 30 Minutes Intravenous Every 24 hours 11/25/22 1957     11/25/22 1600  cefTRIAXone (ROCEPHIN) 2 g in  sodium chloride 0.9 % 100 mL IVPB        2 g 200 mL/hr over 30 Minutes Intravenous  Once 11/25/22 1558 11/25/22 1901   11/25/22 1600  azithromycin (ZITHROMAX) 500 mg in sodium chloride 0.9 % 250 mL IVPB        500 mg 250 mL/hr over 60 Minutes Intravenous  Once 11/25/22 1558 11/25/22 2000       Subjective: Seen and examined at bedside and she was doing okay and felt only a little bit better.  Still felt very weak and fatigued.  Still not able to cough up sputum.  Denies any lightheadedness.  No other concerns or complaints at this time  Objective: Vitals:   11/25/22 2114 11/26/22 0051 11/26/22 0422 11/26/22 0842  BP: 120/60 (!) 124/55 (!) 142/50 (!) 152/49  Pulse: 63 (!) 56 (!) 58 (!) 58  Resp: '16 18 16 20  '$ Temp: 98.6 F (37 C) 98.8 F (37.1 C) 98.1 F (36.7 C) 98.1 F (36.7 C)  TempSrc: Oral Oral Oral Oral  SpO2: 91% 92% 95% 98%  Weight:      Height:        Intake/Output Summary (Last 24 hours) at 11/26/2022 0857 Last  data filed at 11/26/2022 0500 Gross per 24 hour  Intake 426.61 ml  Output --  Net 426.61 ml   Filed Weights   11/25/22 1454  Weight: 64.9 kg   Examination: Physical Exam:  Constitutional: Elderly overweight Caucasian female in no acute distress Respiratory: Diminished to auscultation bilaterally with coarse breath sounds worse on the left compared to the right with some slight rhonchi but no appreciable wheezing, rales or crackles.  Has a normal respiratory effort and she is not tachypneic but she is wearing supplemental oxygen via nasal cannula Cardiovascular: RRR, no murmurs / rubs / gallops. S1 and S2 auscultated. No extremity edema. Abdomen: Soft, non-tender, slightly distended secondary body habitus. Bowel sounds positive.  GU: Deferred. Musculoskeletal: No clubbing / cyanosis of digits/nails. No joint deformity upper and lower extremities. Good ROM, no contractures. Normal strength and muscle tone.  Skin: No rashes, lesions, ulcers on a limited skin  evaluation. No induration; Warm and dry.  Neurologic: CN 2-12 grossly intact with no focal deficits. Romberg sign cerebellar reflexes not assessed.  Psychiatric: Normal judgment and insight. Alert and oriented x 3. Normal mood and appropriate affect.   Data Reviewed: I have personally reviewed following labs and imaging studies  CBC: Recent Labs  Lab 11/25/22 1725 11/26/22 0511  WBC 7.3 6.1  NEUTROABS 4.8 2.8  HGB 14.3 12.7  HCT 43.5 38.9  MCV 98.2 98.7  PLT 228 0000000   Basic Metabolic Panel: Recent Labs  Lab 11/25/22 1725 11/25/22 2222 11/26/22 0511  NA 134*  --  135  K 4.2  --  4.0  CL 99  --  101  CO2 23  --  22  GLUCOSE 110*  --  96  BUN 35*  --  42*  CREATININE 1.85*  --  1.63*  CALCIUM 8.7*  --  8.1*  MG  --  2.2 2.1  PHOS  --   --  4.4   GFR: Estimated Creatinine Clearance: 22.2 mL/min (A) (by C-G formula based on SCr of 1.63 mg/dL (H)). Liver Function Tests: Recent Labs  Lab 11/25/22 1725 11/26/22 0511  AST 68* 56*  ALT 40 33  ALKPHOS 98 87  BILITOT 0.9 0.8  PROT 8.1 7.3  ALBUMIN 4.0 3.6   No results for input(s): "LIPASE", "AMYLASE" in the last 168 hours. No results for input(s): "AMMONIA" in the last 168 hours. Coagulation Profile: No results for input(s): "INR", "PROTIME" in the last 168 hours. Cardiac Enzymes: Recent Labs  Lab 11/25/22 2222  CKTOTAL 172   BNP (last 3 results) No results for input(s): "PROBNP" in the last 8760 hours. HbA1C: No results for input(s): "HGBA1C" in the last 72 hours. CBG: No results for input(s): "GLUCAP" in the last 168 hours. Lipid Profile: No results for input(s): "CHOL", "HDL", "LDLCALC", "TRIG", "CHOLHDL", "LDLDIRECT" in the last 72 hours. Thyroid Function Tests: Recent Labs    11/25/22 2222  TSH 2.910   Anemia Panel: No results for input(s): "VITAMINB12", "FOLATE", "FERRITIN", "TIBC", "IRON", "RETICCTPCT" in the last 72 hours. Sepsis Labs: Recent Labs  Lab 11/25/22 2222  PROCALCITON 0.29     Recent Results (from the past 240 hour(s))  Resp panel by RT-PCR (RSV, Flu A&B, Covid) Anterior Nasal Swab     Status: None   Collection Time: 11/25/22  9:18 PM   Specimen: Anterior Nasal Swab  Result Value Ref Range Status   SARS Coronavirus 2 by RT PCR NEGATIVE NEGATIVE Final    Comment: (NOTE) SARS-CoV-2 target nucleic acids are NOT  DETECTED.  The SARS-CoV-2 RNA is generally detectable in upper respiratory specimens during the acute phase of infection. The lowest concentration of SARS-CoV-2 viral copies this assay can detect is 138 copies/mL. A negative result does not preclude SARS-Cov-2 infection and should not be used as the sole basis for treatment or other patient management decisions. A negative result may occur with  improper specimen collection/handling, submission of specimen other than nasopharyngeal swab, presence of viral mutation(s) within the areas targeted by this assay, and inadequate number of viral copies(<138 copies/mL). A negative result must be combined with clinical observations, patient history, and epidemiological information. The expected result is Negative.  Fact Sheet for Patients:  EntrepreneurPulse.com.au  Fact Sheet for Healthcare Providers:  IncredibleEmployment.be  This test is no t yet approved or cleared by the [place] FDA and  has been authorized for detection and/or diagnosis of SARS-CoV-2 by FDA under an Emergency Use Authorization (EUA). This EUA will remain  in effect (meaning this test can be used) for the duration of the COVID-19 declaration under Section 564(b)(1) of the Act, 21 U.S.C.section 360bbb-3(b)(1), unless the authorization is terminated  or revoked sooner.       Influenza A by PCR NEGATIVE NEGATIVE Final   Influenza B by PCR NEGATIVE NEGATIVE Final    Comment: (NOTE) The Xpert Xpress SARS-CoV-2/FLU/RSV plus assay is intended as an aid in the diagnosis of influenza from  Nasopharyngeal swab specimens and should not be used as a sole basis for treatment. Nasal washings and aspirates are unacceptable for Xpert Xpress SARS-CoV-2/FLU/RSV testing.  Fact Sheet for Patients: EntrepreneurPulse.com.au  Fact Sheet for Healthcare Providers: IncredibleEmployment.be  This test is not yet approved or cleared by the [place] FDA and has been authorized for detection and/or diagnosis of SARS-CoV-2 by FDA under an Emergency Use Authorization (EUA). This EUA will remain in effect (meaning this test can be used) for the duration of the COVID-19 declaration under Section 564(b)(1) of the Act, 21 U.S.C. section 360bbb-3(b)(1), unless the authorization is terminated or revoked.     Resp Syncytial Virus by PCR NEGATIVE NEGATIVE Final    Comment: (NOTE) Fact Sheet for Patients: EntrepreneurPulse.com.au  Fact Sheet for Healthcare Providers: IncredibleEmployment.be  This test is not yet approved or cleared by the [place] FDA and has been authorized for detection and/or diagnosis of SARS-CoV-2 by FDA under an Emergency Use Authorization (EUA). This EUA will remain in effect (meaning this test can be used) for the duration of the COVID-19 declaration under Section 564(b)(1) of the Act, 21 U.S.C. section 360bbb-3(b)(1), unless the authorization is terminated or revoked.  Performed at [facility], [place] [address] [place], [place] [address]   Culture, blood (routine x 2)     Status: None (Preliminary result)   Collection Time: 11/25/22 10:22 PM   Specimen: BLOOD  Result Value Ref Range Status   Specimen Description   Final    BLOOD BLOOD LEFT ARM Performed at [facility] [address], [place], [place] [address]    Special Requests   Final    BOTTLES DRAWN AEROBIC ONLY Blood Culture adequate volume Performed at [facility] [address], [place], [place] [address]    Culture   Final    NO GROWTH < 12 HOURS Performed at [facility] [address], [place], [place] [address]    Report Status PENDING  Incomplete    Radiology Studies: DG Chest 2 View  Result Date: 11/25/2022  CLINICAL DATA:  Shortness of breath and cough. EXAM: CHEST - 2 VIEW COMPARISON:  11/25/2022 and prior studies FINDINGS: This is a mildly low volume film. The cardiomediastinal silhouette is unchanged. LEFT LOWER lobe airspace opacity with possible trace LEFT pleural effusion noted. The RIGHT lung is clear. There is no evidence of pneumothorax or acute bony abnormality. IMPRESSION: 1. LEFT LOWER lobe airspace opacity compatible with pneumonia. Radiographic follow-up to resolution is recommended. 2. Possible trace LEFT pleural effusion. Electronically Signed   By: Margarette Canada M.D.   On: 11/25/2022 15:40    Scheduled Meds:  atorvastatin  20 mg Oral Daily   primidone  50 mg Oral QHS   Continuous Infusions:  azithromycin     cefTRIAXone (ROCEPHIN)  IV      LOS: 1 day   Raiford Noble, DO [facility] Available via Epic secure chat 7am-7pm After these hours, please refer to coverage provider listed on [email or web address] 11/26/2022, 8:57 AM

## 2022-11-27 ENCOUNTER — Inpatient Hospital Stay (HOSPITAL_COMMUNITY): Payer: Medicare Other

## 2022-11-27 DIAGNOSIS — J189 Pneumonia, unspecified organism: Secondary | ICD-10-CM | POA: Diagnosis not present

## 2022-11-27 LAB — URINALYSIS, COMPLETE (UACMP) WITH MICROSCOPIC
Bacteria, UA: NONE SEEN
Bilirubin Urine: NEGATIVE
Glucose, UA: NEGATIVE mg/dL
Hgb urine dipstick: NEGATIVE
Ketones, ur: 20 mg/dL — AB
Leukocytes,Ua: NEGATIVE
Nitrite: NEGATIVE
Protein, ur: NEGATIVE mg/dL
Specific Gravity, Urine: 1.013 (ref 1.005–1.030)
pH: 5 (ref 5.0–8.0)

## 2022-11-27 LAB — CBC WITH DIFFERENTIAL/PLATELET
Abs Immature Granulocytes: 0.02 10*3/uL (ref 0.00–0.07)
Basophils Absolute: 0 10*3/uL (ref 0.0–0.1)
Basophils Relative: 1 %
Eosinophils Absolute: 0.2 10*3/uL (ref 0.0–0.5)
Eosinophils Relative: 3 %
HCT: 35.8 % — ABNORMAL LOW (ref 36.0–46.0)
Hemoglobin: 12.1 g/dL (ref 12.0–15.0)
Immature Granulocytes: 0 %
Lymphocytes Relative: 37 %
Lymphs Abs: 2 10*3/uL (ref 0.7–4.0)
MCH: 32.9 pg (ref 26.0–34.0)
MCHC: 33.8 g/dL (ref 30.0–36.0)
MCV: 97.3 fL (ref 80.0–100.0)
Monocytes Absolute: 0.5 10*3/uL (ref 0.1–1.0)
Monocytes Relative: 10 %
Neutro Abs: 2.6 10*3/uL (ref 1.7–7.7)
Neutrophils Relative %: 49 %
Platelets: 173 10*3/uL (ref 150–400)
RBC: 3.68 MIL/uL — ABNORMAL LOW (ref 3.87–5.11)
RDW: 11.9 % (ref 11.5–15.5)
WBC: 5.3 10*3/uL (ref 4.0–10.5)
nRBC: 0 % (ref 0.0–0.2)

## 2022-11-27 LAB — STREP PNEUMONIAE URINARY ANTIGEN: Strep Pneumo Urinary Antigen: NEGATIVE

## 2022-11-27 LAB — PHOSPHORUS: Phosphorus: 2.6 mg/dL (ref 2.5–4.6)

## 2022-11-27 LAB — COMPREHENSIVE METABOLIC PANEL
ALT: 31 U/L (ref 0–44)
AST: 42 U/L — ABNORMAL HIGH (ref 15–41)
Albumin: 3.2 g/dL — ABNORMAL LOW (ref 3.5–5.0)
Alkaline Phosphatase: 82 U/L (ref 38–126)
Anion gap: 11 (ref 5–15)
BUN: 21 mg/dL (ref 8–23)
CO2: 23 mmol/L (ref 22–32)
Calcium: 8.1 mg/dL — ABNORMAL LOW (ref 8.9–10.3)
Chloride: 102 mmol/L (ref 98–111)
Creatinine, Ser: 0.84 mg/dL (ref 0.44–1.00)
GFR, Estimated: >60 mL/min (ref >60)
Glucose, Bld: 89 mg/dL (ref 70–99)
Potassium: 4.3 mmol/L (ref 3.5–5.1)
Sodium: 136 mmol/L (ref 135–145)
Total Bilirubin: 0.6 mg/dL (ref 0.3–1.2)
Total Protein: 6.9 g/dL (ref 6.5–8.1)

## 2022-11-27 LAB — CREATININE, URINE, RANDOM: Creatinine, Urine: 76 mg/dL

## 2022-11-27 LAB — SODIUM, URINE, RANDOM: Sodium, Ur: 69 mmol/L

## 2022-11-27 LAB — MAGNESIUM: Magnesium: 2.1 mg/dL (ref 1.7–2.4)

## 2022-11-27 MED ORDER — IPRATROPIUM BROMIDE 0.02 % IN SOLN
0.5000 mg | Freq: Two times a day (BID) | RESPIRATORY_TRACT | Status: DC
  Administered 2022-11-27 – 2022-11-28 (×2): 0.5 mg via RESPIRATORY_TRACT
  Filled 2022-11-27 (×2): qty 2.5

## 2022-11-27 MED ORDER — LEVALBUTEROL HCL 0.63 MG/3ML IN NEBU
0.6300 mg | INHALATION_SOLUTION | Freq: Two times a day (BID) | RESPIRATORY_TRACT | Status: DC
  Administered 2022-11-27 – 2022-11-28 (×2): 0.63 mg via RESPIRATORY_TRACT
  Filled 2022-11-27 (×2): qty 3

## 2022-11-27 MED ORDER — DOXYCYCLINE HYCLATE 100 MG PO TABS
100.0000 mg | ORAL_TABLET | Freq: Two times a day (BID) | ORAL | Status: DC
  Administered 2022-11-27 – 2022-11-28 (×3): 100 mg via ORAL
  Filled 2022-11-27 (×3): qty 1

## 2022-11-27 MED ORDER — LACTATED RINGERS IV SOLN: INTRAVENOUS | Status: DC

## 2022-11-27 NOTE — Progress Notes (Signed)
Mobility Specialist - Progress Note   11/27/22 1059  Mobility  Activity Ambulated with assistance in hallway  Level of Assistance Standby assist, set-up cues, supervision of patient - no hands on  Assistive Device None  Distance Ambulated (ft) 300 ft  Activity Response Tolerated well  Mobility Referral Yes  $Mobility charge 1 Mobility   Pt received in the chair and agreed to mobility, with no c/o pain nor discomfort during session.   Nurse requested Mobility Specialist to perform oxygen saturation test with pt which includes removing pt from oxygen both at rest and while ambulating.  Below are the results from that testing.     Patient Saturations on Room Air at Rest = spO2 88%  Reported results to nurse.   Continued to ambulate on 1L, SpO2 was at 91%. Pt returned to chair with all needs met.  Roderick Pee Mobility Specialist

## 2022-11-27 NOTE — Progress Notes (Signed)
PROGRESS NOTE   Lauren Key  [id number] DOB: 1941/07/04 DOA: 11/25/2022 PCP: Antony Contras, MD  Brief Narrative:   82 WF known prior history of In 2015 HTN, HLD, prior seizure disorder 5 days of generalized weakness starting on 3/7 without any focal deficit and developed and a new onset nonproductive cough without hemoptysis On admission found to have acute hypoxic respiratory failure with sats in the 80s improved to the 90s-found to have, CXR = LLL PNA without other issues-COVID influenza RSV   [-] Also found to have AKI generalized weakness  Hospital-Problem based course  Left lower lobe pneumonia -Strep pneumo negative, BCx 2 NGTD - Azithromycin/ceftriaxone changed to doxycycline given patient's improvement and ability to walk 500 feet without a whole lot of dyspnea - Discontinue saline, continue Tessalon, Mucinex, nebs/cepachol  AKI on admission Was on Micardis 80 at bedtime prior to admission which has been held -Resolving well with IVF - Periodic labs  HTN - Continue atenolol 100 daily amlodipine 10 (Micardis held) -Uncontrolled and will have to probably add low-dose nitrate to meds versus drop dose of Micardis if still elevated a.m.  Tremor with predominant head-bobbing - Continue primidone 50 HS  DVT prophylaxis: SCD Code Status: Full Family Communication: None Disposition:  Status is: Inpatient Remains inpatient appropriate because:   Need to ensure no further fever chills or other issues but likely can discharge in 24-48 H    Subjective: Awake coherent no distress looks comfortable not on oxygen walked 500 feet no chest pain no fever some sputum does not cough when she eats  Objective: Vitals:   11/26/22 1923 11/26/22 2114 11/27/22 0517 11/27/22 0747  BP:  120/60 (!) 146/53   Pulse:  63 (!) 54   Resp:  16 16   Temp:  98.6 F (37 C) 98.3 F (36.8 C)   TempSrc:  Oral Oral   SpO2: 93% 91% 90% 92%  Weight:      Height:        Intake/Output  Summary (Last 24 hours) at 11/27/2022 0937 Last data filed at 11/27/2022 0549 Gross per 24 hour  Intake 1401.25 ml  Output --  Net 1401.25 ml   Filed Weights   11/25/22 1454  Weight: 64.9 kg    Examination:  Pleasant younger than stated age no icterus no pallor no rales no rhonchi no wheeze ROM intact power 5/5 Mild head-bobbing S1-S2 no murmur no rub no gallop No lower extremity edema   Data Reviewed: personally reviewed   CBC    Component Value Date/Time   WBC 5.3 11/27/2022 0521   RBC 3.68 (L) 11/27/2022 0521   HGB 12.1 11/27/2022 0521   HCT 35.8 (L) 11/27/2022 0521   PLT 173 11/27/2022 0521   MCV 97.3 11/27/2022 0521   MCH 32.9 11/27/2022 0521   MCHC 33.8 11/27/2022 0521   RDW 11.9 11/27/2022 0521   LYMPHSABS 2.0 11/27/2022 0521   MONOABS 0.5 11/27/2022 0521   EOSABS 0.2 11/27/2022 0521   BASOSABS 0.0 11/27/2022 0521      Latest Ref Rng & Units 11/27/2022    5:21 AM 11/26/2022    5:11 AM 11/25/2022    5:25 PM  CMP  Glucose 70 - 99 mg/dL 89  96  110   BUN 8 - 23 mg/dL 21  42  35   Creatinine 0.44 - 1.00 mg/dL 0.84  1.63  1.85   Sodium 135 - 145 mmol/L 136  135  134   Potassium 3.5 - 5.1 mmol/L  4.3  4.0  4.2   Chloride 98 - 111 mmol/L 102  101  99   CO2 22 - 32 mmol/L '23  22  23   '$ Calcium 8.9 - 10.3 mg/dL 8.1  8.1  8.7   Total Protein 6.5 - 8.1 g/dL 6.9  7.3  8.1   Total Bilirubin 0.3 - 1.2 mg/dL 0.6  0.8  0.9   Alkaline Phos 38 - 126 U/L 82  87  98   AST 15 - 41 U/L 42  56  68   ALT 0 - 44 U/L 31  33  40      Radiology Studies: DG CHEST PORT 1 VIEW  Result Date: 11/27/2022 CLINICAL DATA:  82 year old female with shortness of breath. EXAM: PORTABLE CHEST - 1 VIEW COMPARISON:  11/15/2022, 12/01/2020 FINDINGS: The mediastinal contours are within normal limits. No cardiomegaly. Atherosclerotic calcification of the aortic arch. Slight interval progression of bibasilar hazy opacities, left greater than right. Possible trace left pleural effusion. No evidence  pneumothorax. No acute osseous abnormality. IMPRESSION: 1. Slight interval progression of bibasilar hazy opacities, left greater than right, which could represent atelectasis or infection. 2. Possible trace left pleural effusion. Electronically Signed   By: Ruthann Cancer M.D.   On: 11/27/2022 08:01   DG Chest 2 View  Result Date: 11/25/2022 CLINICAL DATA:  Shortness of breath and cough. EXAM: CHEST - 2 VIEW COMPARISON:  11/25/2022 and prior studies FINDINGS: This is a mildly low volume film. The cardiomediastinal silhouette is unchanged. LEFT LOWER lobe airspace opacity with possible trace LEFT pleural effusion noted. The RIGHT lung is clear. There is no evidence of pneumothorax or acute bony abnormality. IMPRESSION: 1. LEFT LOWER lobe airspace opacity compatible with pneumonia. Radiographic follow-up to resolution is recommended. 2. Possible trace LEFT pleural effusion. Electronically Signed   By: Margarette Canada M.D.   On: 11/25/2022 15:40     Scheduled Meds:  amLODipine  10 mg Oral Daily   atenolol  50 mg Oral Daily   atorvastatin  20 mg Oral Daily   guaiFENesin  1,200 mg Oral BID   ipratropium  0.5 mg Nebulization BID   levalbuterol  0.63 mg Nebulization BID   primidone  50 mg Oral QHS   Continuous Infusions:  azithromycin 500 mg (11/26/22 1921)   cefTRIAXone (ROCEPHIN)  IV 1 g (11/26/22 1842)     LOS: 2 days   Time spent: 72  Nita Sells, MD [facility] To contact the attending provider between 7A-7P or the covering provider during after hours 7P-7A, please log into the web site [email or web address] and access using universal [place] password for that web site. If you do not have the password, please call the hospital operator.  11/27/2022, 9:37 AM

## 2022-11-27 NOTE — Progress Notes (Signed)
  Transition of Care [facility]) Screening Note   Patient Details  Name: Lauren Key Date of Birth: [birth year]   Transition of Care [facility]) CM/SW Contact:    Vassie Moselle, [place] Phone Number: 11/27/2022, 9:44 AM    Transition of Care Department [facility]) has reviewed patient and no TOC needs have been identified at this time. We will continue to monitor patient advancement through interdisciplinary progression rounds. If new patient transition needs arise, please place a TOC consult.

## 2022-11-28 DIAGNOSIS — J189 Pneumonia, unspecified organism: Secondary | ICD-10-CM | POA: Diagnosis not present

## 2022-11-28 MED ORDER — ATENOLOL 50 MG PO TABS: 50.0000 mg | ORAL_TABLET | Freq: Every day | ORAL | Status: AC

## 2022-11-28 MED ORDER — BENZONATATE 200 MG PO CAPS: 200.0000 mg | ORAL_CAPSULE | Freq: Three times a day (TID) | ORAL | 0 refills | Status: AC | PRN

## 2022-11-28 MED ORDER — MUCINEX DM MAXIMUM STRENGTH 60-1200 MG PO TB12: 1.0000 | ORAL_TABLET | Freq: Two times a day (BID) | ORAL | 0 refills | Status: AC

## 2022-11-28 MED ORDER — MENTHOL 3 MG MT LOZG: 1.0000 | LOZENGE | OROMUCOSAL | 12 refills | Status: AC | PRN

## 2022-11-28 MED ORDER — DOXYCYCLINE HYCLATE 100 MG PO TABS: 100.0000 mg | ORAL_TABLET | Freq: Two times a day (BID) | ORAL | 0 refills | Status: AC

## 2022-11-28 NOTE — Progress Notes (Addendum)
Mobility Specialist - Progress Note   11/28/22 0948  Oxygen Therapy  SpO2 91 %  O2 Device Room Air  Mobility  Activity Ambulated with assistance in hallway  Level of Assistance Standby assist, set-up cues, supervision of patient - no hands on  Assistive Device None  Distance Ambulated (ft) 130 ft  Activity Response Tolerated well  Mobility Referral Yes  $Mobility charge 1 Mobility   Pt received in chair and agreed to mobility, had no issues throughout session.  Nurse requested Mobility Specialist to perform oxygen saturation test with pt which includes removing pt from oxygen both at rest and while ambulating.  Below are the results from that testing.     Patient Saturations on Room Air at Rest = spO2 94%  Patient Saturations on Room Air while Ambulating = sp02 92% .    At end of testing pt left in room on 0 liters of oxygen.  Reported results to nurse.   Pt left in chair with all needs met.   Roderick Pee Mobility Specialist

## 2022-11-28 NOTE — Discharge Summary (Signed)
Physician Discharge Summary  Lauren Key NQ:[id number] DOB: 1941/05/30 DOA: 11/25/2022  PCP: Antony Contras, MD  Admit date: 11/25/2022 Discharge date: 11/28/2022  Time spent: 44 minutes  Recommendations for Outpatient Follow-up:  Recommend CBC Chem-12 1 week Get CXR in about 1 month Discontinued this admission Micardis/Hycodan  Discharge Diagnoses:  MAIN problem for hospitalization   Left lower lobe pneumonia without sepsis on admission AKI on admission likely secondary to infection and PTA Micardis HTN Tremor  Please see below for itemized issues addressed in HOpsital- refer to other progress notes for clarity if needed  Discharge Condition: Improved  Diet recommendation: Heart healthy  Filed Weights   11/25/22 1454  Weight: 64.9 kg    History of present illness:  82 WF known prior history of In 2015 HTN, HLD, prior seizure disorder 5 days of generalized weakness starting on 3/7 without any focal deficit and developed and a new onset nonproductive cough without hemoptysis On admission found to have acute hypoxic respiratory failure with sats in the 80s improved to the 90s-found to have, CXR = LLL PNA without other issues-COVID influenza RSV   [-] Also found to have AKI generalized weakness  Hospital Course:  Left lower lobe pneumonia -Strep pneumo negative, BCx 2 NGTD - Azithromycin/ceftriaxone changed to doxycycline, and patient to complete the course as described - Discontinue saline, continue Tessalon, Mucinex, nebs as needed /Cephacol   AKI on admission multifactorial secondary to infection, PTA ARB Was on Micardis 80 at bedtime prior to admission which has been held - Periodic labs as an outpatient   HTN - Continue atenolol 50 daily amlodipine 10 (Micardis held) - Will need reevaluation of strategy to control blood pressure as an outpatient  Tremor with predominant head-bobbing - Continue primidone 50 HS   Discharge Exam: Vitals:   11/28/22 0732  11/28/22 0948  BP:    Pulse: 61   Resp: 16   Temp:    SpO2: 92% 91%    Subj on day of d/c   Doing well-dressed and ready to leave   General Exam on discharge  Awake alert coherent no distress EOMI NCAT no focal deficit CTAB no added sound rales rhonchi no wheeze Abdomen soft no rebound no guarding No lower extremity edema  Discharge Instructions   Discharge Instructions     Diet - low sodium heart healthy   Complete by: As directed    Discharge instructions   Complete by: As directed    Please review your medication list carefully as several blood pressure medications have changed [we discontinued your Micardis and you should no longer take chlorthalidone if that is what you were taking] You will need to also complete the course of doxycycline as we have prescribed for your pneumonia--you may use ad lib. Cepacol as well as Best boy which we will prescribe for you and a limited amount--- do not take Tussionex as it does contain codeine [would talk to your doctor about taking that if your breathing etc. do not get better] Would recommend also that you get an x-ray in about a month's time to determine if your lungs have cleared from the pneumonia Warning signs indicating that you should return would be severe cough cold high fevers above 100.5 and chills or rigors  Best of luck have a good week and   Increase activity slowly   Complete by: As directed       Allergies as of 11/28/2022       Reactions   Tetanus Toxoids  Shortness Of Breath, Other (See Comments)   "Could not breathe"   Tetanus-diphth-acell Pertussis Shortness Of Breath, Other (See Comments)   Patient had reaction to tetanus vaccine as a child and does not wish to get any more tetanus boosters   Prednisone Other (See Comments)   Makes the patient feel "hyper"        Medication List     STOP taking these medications    chlorpheniramine-HYDROcodone 10-8 MG/5ML Suer Commonly known as: Tussionex  Pennkinetic ER   doxycycline 100 MG capsule Commonly known as: VIBRAMYCIN Replaced by: doxycycline 100 MG tablet   telmisartan 80 MG tablet Commonly known as: MICARDIS       TAKE these medications    albuterol 108 (90 Base) MCG/ACT inhaler Commonly known as: VENTOLIN HFA Inhale 1-2 puffs into the lungs every 6 (six) hours as needed for wheezing or shortness of breath.   amLODipine 10 MG tablet Commonly known as: NORVASC Take 10 mg by mouth daily.   atenolol 50 MG tablet Commonly known as: TENORMIN Take 1 tablet (50 mg total) by mouth daily. What changed:  medication strength how much to take   atorvastatin 20 MG tablet Commonly known as: LIPITOR Take 20 mg by mouth daily.   benzonatate 200 MG capsule Commonly known as: TESSALON Take 1 capsule (200 mg total) by mouth 3 (three) times daily as needed for cough.   doxycycline 100 MG tablet Commonly known as: VIBRA-TABS Take 1 tablet (100 mg total) by mouth every 12 (twelve) hours for 1 day. Replaces: doxycycline 100 MG capsule   menthol-cetylpyridinium 3 MG lozenge Commonly known as: CEPACOL Take 1 lozenge (3 mg total) by mouth as needed for sore throat.   Mucinex DM Maximum Strength 60-1200 MG Tb12 Take 1 tablet by mouth every 12 (twelve) hours.   primidone 50 MG tablet Commonly known as: MYSOLINE Take 50 mg by mouth in the morning and at bedtime.       Allergies  Allergen Reactions   Tetanus Toxoids Shortness Of Breath and Other (See Comments)    "Could not breathe"   Tetanus-Diphth-Acell Pertussis Shortness Of Breath and Other (See Comments)    Patient had reaction to tetanus vaccine as a child and does not wish to get any more tetanus boosters   Prednisone Other (See Comments)    Makes the patient feel "hyper"      The results of significant diagnostics from this hospitalization (including imaging, microbiology, ancillary and laboratory) are listed below for reference.    Significant Diagnostic  Studies: DG CHEST PORT 1 VIEW  Result Date: 11/27/2022 CLINICAL DATA:  82 year old female with shortness of breath. EXAM: PORTABLE CHEST - 1 VIEW COMPARISON:  11/15/2022, 12/01/2020 FINDINGS: The mediastinal contours are within normal limits. No cardiomegaly. Atherosclerotic calcification of the aortic arch. Slight interval progression of bibasilar hazy opacities, left greater than right. Possible trace left pleural effusion. No evidence pneumothorax. No acute osseous abnormality. IMPRESSION: 1. Slight interval progression of bibasilar hazy opacities, left greater than right, which could represent atelectasis or infection. 2. Possible trace left pleural effusion. Electronically Signed   By: Ruthann Cancer M.D.   On: 11/27/2022 08:01   DG Chest 2 View  Result Date: 11/25/2022 CLINICAL DATA:  Shortness of breath and cough. EXAM: CHEST - 2 VIEW COMPARISON:  11/25/2022 and prior studies FINDINGS: This is a mildly low volume film. The cardiomediastinal silhouette is unchanged. LEFT LOWER lobe airspace opacity with possible trace LEFT pleural effusion noted. The RIGHT lung is  clear. There is no evidence of pneumothorax or acute bony abnormality. IMPRESSION: 1. LEFT LOWER lobe airspace opacity compatible with pneumonia. Radiographic follow-up to resolution is recommended. 2. Possible trace LEFT pleural effusion. Electronically Signed   By: Margarette Canada M.D.   On: 11/25/2022 15:40    Microbiology: Recent Results (from the past 240 hour(s))  Resp panel by RT-PCR (RSV, Flu A&B, Covid) Anterior Nasal Swab     Status: None   Collection Time: 11/25/22  9:18 PM   Specimen: Anterior Nasal Swab  Result Value Ref Range Status   SARS Coronavirus 2 by RT PCR NEGATIVE NEGATIVE Final    Comment: (NOTE) SARS-CoV-2 target nucleic acids are NOT DETECTED.  The SARS-CoV-2 RNA is generally detectable in upper respiratory specimens during the acute phase of infection. The lowest concentration of SARS-CoV-2 viral copies this  assay can detect is 138 copies/mL. A negative result does not preclude SARS-Cov-2 infection and should not be used as the sole basis for treatment or other patient management decisions. A negative result may occur with  improper specimen collection/handling, submission of specimen other than nasopharyngeal swab, presence of viral mutation(s) within the areas targeted by this assay, and inadequate number of viral copies(<138 copies/mL). A negative result must be combined with clinical observations, patient history, and epidemiological information. The expected result is Negative.  Fact Sheet for Patients:  EntrepreneurPulse.com.au  Fact Sheet for Healthcare Providers:  IncredibleEmployment.be  This test is no t yet approved or cleared by the [place] FDA and  has been authorized for detection and/or diagnosis of SARS-CoV-2 by FDA under an Emergency Use Authorization (EUA). This EUA will remain  in effect (meaning this test can be used) for the duration of the COVID-19 declaration under Section 564(b)(1) of the Act, 21 U.S.C.section 360bbb-3(b)(1), unless the authorization is terminated  or revoked sooner.       Influenza A by PCR NEGATIVE NEGATIVE Final   Influenza B by PCR NEGATIVE NEGATIVE Final    Comment: (NOTE) The Xpert Xpress SARS-CoV-2/FLU/RSV plus assay is intended as an aid in the diagnosis of influenza from Nasopharyngeal swab specimens and should not be used as a sole basis for treatment. Nasal washings and aspirates are unacceptable for Xpert Xpress SARS-CoV-2/FLU/RSV testing.  Fact Sheet for Patients: EntrepreneurPulse.com.au  Fact Sheet for Healthcare Providers: IncredibleEmployment.be  This test is not yet approved or cleared by the [place] FDA and has been authorized for detection and/or diagnosis of SARS-CoV-2 by FDA under an Emergency Use Authorization (EUA). This EUA will  remain in effect (meaning this test can be used) for the duration of the COVID-19 declaration under Section 564(b)(1) of the Act, 21 U.S.C. section 360bbb-3(b)(1), unless the authorization is terminated or revoked.     Resp Syncytial Virus by PCR NEGATIVE NEGATIVE Final    Comment: (NOTE) Fact Sheet for Patients: EntrepreneurPulse.com.au  Fact Sheet for Healthcare Providers: IncredibleEmployment.be  This test is not yet approved or cleared by the [place] FDA and has been authorized for detection and/or diagnosis of SARS-CoV-2 by FDA under an Emergency Use Authorization (EUA). This EUA will remain in effect (meaning this test can be used) for the duration of the COVID-19 declaration under Section 564(b)(1) of the Act, 21 U.S.C. section 360bbb-3(b)(1), unless the authorization is terminated or revoked.  Performed at [facility], [place] [address] [place],  [address]   Culture, blood (routine x 2)     Status: None (Preliminary result)   Collection Time: 11/25/22 10:22 PM  Specimen: BLOOD  Result Value Ref Range Status   Specimen Description   Final    BLOOD BLOOD LEFT ARM Performed at [facility] [address], [place], [place] [address]    Special Requests   Final    BOTTLES DRAWN AEROBIC ONLY Blood Culture adequate volume Performed at [facility] [address], [place], [place] [address]    Culture   Final    NO GROWTH 2 DAYS Performed at [facility] [address] [place], [place] [address]    Report Status PENDING  Incomplete     Labs: Basic Metabolic Panel: Recent Labs  Lab 11/25/22 1725 11/25/22 2222 11/26/22 0511 11/27/22 0521  NA 134*  --  135 136  K 4.2  --  4.0 4.3  CL 99  --  101 102  CO2 23  --  22 23  GLUCOSE 110*  --  96 89  BUN 35*  --  42* 21  CREATININE 1.85*  --  1.63* 0.84  CALCIUM 8.7*  --  8.1* 8.1*  MG  --  2.2 2.1 2.1   PHOS  --   --  4.4 2.6   Liver Function Tests: Recent Labs  Lab 11/25/22 1725 11/26/22 0511 11/27/22 0521  AST 68* 56* 42*  ALT 40 33 31  ALKPHOS 98 87 82  BILITOT 0.9 0.8 0.6  PROT 8.1 7.3 6.9  ALBUMIN 4.0 3.6 3.2*   No results for input(s): "LIPASE", "AMYLASE" in the last 168 hours. No results for input(s): "AMMONIA" in the last 168 hours. CBC: Recent Labs  Lab 11/25/22 1725 11/26/22 0511 11/27/22 0521  WBC 7.3 6.1 5.3  NEUTROABS 4.8 2.8 2.6  HGB 14.3 12.7 12.1  HCT 43.5 38.9 35.8*  MCV 98.2 98.7 97.3  PLT 228 181 173   Cardiac Enzymes: Recent Labs  Lab 11/25/22 2222  CKTOTAL 172   BNP: BNP (last 3 results) Recent Labs    11/25/22 2222  BNP 33.6    ProBNP (last 3 results) No results for input(s): "PROBNP" in the last 8760 hours.  CBG: No results for input(s): "GLUCAP" in the last 168 hours.     Signed:  Nita Sells MD   [facility] 11/28/2022, 10:04 AM

## 2022-11-28 NOTE — Progress Notes (Signed)
Discharge instructions reviewed with pt and questions answered to her satisfaction.  IV removed and pt taken to discharge area via wheelchair.

## 2022-11-30 LAB — CULTURE, BLOOD (ROUTINE X 2)
Culture: NO GROWTH
Special Requests: ADEQUATE

## 2022-12-02 LAB — CULTURE, BLOOD (ROUTINE X 2)
Culture: NO GROWTH
Special Requests: ADEQUATE

## 2022-12-05 DIAGNOSIS — I10 Essential (primary) hypertension: Secondary | ICD-10-CM | POA: Diagnosis not present

## 2022-12-05 DIAGNOSIS — N179 Acute kidney failure, unspecified: Secondary | ICD-10-CM | POA: Diagnosis not present

## 2022-12-05 DIAGNOSIS — J189 Pneumonia, unspecified organism: Secondary | ICD-10-CM | POA: Diagnosis not present

## 2022-12-05 DIAGNOSIS — N760 Acute vaginitis: Secondary | ICD-10-CM | POA: Diagnosis not present

## 2022-12-08 DIAGNOSIS — R051 Acute cough: Secondary | ICD-10-CM | POA: Diagnosis not present

## 2022-12-30 ENCOUNTER — Ambulatory Visit
Admission: RE | Admit: 2022-12-30 | Discharge: 2022-12-30 | Disposition: A | Discharge status: Home / Self Care | Payer: Medicare Other | Source: Ambulatory Visit | Attending: Family Medicine | Admitting: Family Medicine

## 2022-12-30 ENCOUNTER — Other Ambulatory Visit: Payer: Self-pay | Admitting: Family Medicine

## 2022-12-30 DIAGNOSIS — I700 Atherosclerosis of aorta: Secondary | ICD-10-CM | POA: Diagnosis not present

## 2022-12-30 DIAGNOSIS — J189 Pneumonia, unspecified organism: Secondary | ICD-10-CM

## 2022-12-30 DIAGNOSIS — Z8701 Personal history of pneumonia (recurrent): Secondary | ICD-10-CM | POA: Diagnosis not present

## 2023-01-02 DIAGNOSIS — I10 Essential (primary) hypertension: Secondary | ICD-10-CM | POA: Diagnosis not present

## 2023-05-16 DIAGNOSIS — R7303 Prediabetes: Secondary | ICD-10-CM | POA: Diagnosis not present

## 2023-05-16 DIAGNOSIS — K760 Fatty (change of) liver, not elsewhere classified: Secondary | ICD-10-CM | POA: Diagnosis not present

## 2023-05-16 DIAGNOSIS — Z8719 Personal history of other diseases of the digestive system: Secondary | ICD-10-CM | POA: Diagnosis not present

## 2023-05-16 DIAGNOSIS — E782 Mixed hyperlipidemia: Secondary | ICD-10-CM | POA: Diagnosis not present

## 2023-05-16 DIAGNOSIS — I10 Essential (primary) hypertension: Secondary | ICD-10-CM | POA: Diagnosis not present

## 2023-05-16 DIAGNOSIS — J309 Allergic rhinitis, unspecified: Secondary | ICD-10-CM | POA: Diagnosis not present

## 2023-05-16 DIAGNOSIS — M109 Gout, unspecified: Secondary | ICD-10-CM | POA: Diagnosis not present

## 2023-05-16 DIAGNOSIS — E559 Vitamin D deficiency, unspecified: Secondary | ICD-10-CM | POA: Diagnosis not present

## 2023-05-16 DIAGNOSIS — R251 Tremor, unspecified: Secondary | ICD-10-CM | POA: Diagnosis not present

## 2023-05-16 DIAGNOSIS — K5900 Constipation, unspecified: Secondary | ICD-10-CM | POA: Diagnosis not present

## 2023-05-16 DIAGNOSIS — Z8619 Personal history of other infectious and parasitic diseases: Secondary | ICD-10-CM | POA: Diagnosis not present

## 2023-05-16 DIAGNOSIS — M85852 Other specified disorders of bone density and structure, left thigh: Secondary | ICD-10-CM | POA: Diagnosis not present

## 2023-06-19 DIAGNOSIS — M79672 Pain in left foot: Secondary | ICD-10-CM | POA: Diagnosis not present

## 2023-06-19 DIAGNOSIS — M10072 Idiopathic gout, left ankle and foot: Secondary | ICD-10-CM | POA: Diagnosis not present

## 2023-06-19 DIAGNOSIS — L602 Onychogryphosis: Secondary | ICD-10-CM | POA: Diagnosis not present

## 2023-06-19 DIAGNOSIS — M10071 Idiopathic gout, right ankle and foot: Secondary | ICD-10-CM | POA: Diagnosis not present

## 2023-06-26 DIAGNOSIS — M79672 Pain in left foot: Secondary | ICD-10-CM | POA: Diagnosis not present

## 2023-06-26 DIAGNOSIS — L602 Onychogryphosis: Secondary | ICD-10-CM | POA: Diagnosis not present

## 2023-06-26 DIAGNOSIS — M10072 Idiopathic gout, left ankle and foot: Secondary | ICD-10-CM | POA: Diagnosis not present

## 2023-08-12 DIAGNOSIS — M10071 Idiopathic gout, right ankle and foot: Secondary | ICD-10-CM | POA: Diagnosis not present

## 2023-08-21 DIAGNOSIS — M79672 Pain in left foot: Secondary | ICD-10-CM | POA: Diagnosis not present

## 2023-08-21 DIAGNOSIS — M10072 Idiopathic gout, left ankle and foot: Secondary | ICD-10-CM | POA: Diagnosis not present

## 2023-08-21 DIAGNOSIS — M10071 Idiopathic gout, right ankle and foot: Secondary | ICD-10-CM | POA: Diagnosis not present

## 2023-08-21 DIAGNOSIS — M79671 Pain in right foot: Secondary | ICD-10-CM | POA: Diagnosis not present

## 2023-10-25 DIAGNOSIS — R062 Wheezing: Secondary | ICD-10-CM | POA: Diagnosis not present

## 2023-10-25 DIAGNOSIS — J209 Acute bronchitis, unspecified: Secondary | ICD-10-CM | POA: Diagnosis not present

## 2023-10-25 DIAGNOSIS — R051 Acute cough: Secondary | ICD-10-CM | POA: Diagnosis not present

## 2023-10-25 DIAGNOSIS — R509 Fever, unspecified: Secondary | ICD-10-CM | POA: Diagnosis not present

## 2023-10-25 DIAGNOSIS — J101 Influenza due to other identified influenza virus with other respiratory manifestations: Secondary | ICD-10-CM | POA: Diagnosis not present

## 2023-12-29 DIAGNOSIS — R591 Generalized enlarged lymph nodes: Secondary | ICD-10-CM | POA: Diagnosis not present

## 2023-12-29 DIAGNOSIS — M109 Gout, unspecified: Secondary | ICD-10-CM | POA: Diagnosis not present

## 2023-12-29 DIAGNOSIS — M85852 Other specified disorders of bone density and structure, left thigh: Secondary | ICD-10-CM | POA: Diagnosis not present

## 2023-12-29 DIAGNOSIS — E559 Vitamin D deficiency, unspecified: Secondary | ICD-10-CM | POA: Diagnosis not present

## 2023-12-29 DIAGNOSIS — I10 Essential (primary) hypertension: Secondary | ICD-10-CM | POA: Diagnosis not present

## 2023-12-29 DIAGNOSIS — R7303 Prediabetes: Secondary | ICD-10-CM | POA: Diagnosis not present

## 2023-12-29 DIAGNOSIS — Z0000 Encounter for general adult medical examination without abnormal findings: Secondary | ICD-10-CM | POA: Diagnosis not present

## 2023-12-29 DIAGNOSIS — E782 Mixed hyperlipidemia: Secondary | ICD-10-CM | POA: Diagnosis not present

## 2023-12-29 DIAGNOSIS — K5900 Constipation, unspecified: Secondary | ICD-10-CM | POA: Diagnosis not present

## 2023-12-29 DIAGNOSIS — K760 Fatty (change of) liver, not elsewhere classified: Secondary | ICD-10-CM | POA: Diagnosis not present

## 2023-12-29 DIAGNOSIS — R251 Tremor, unspecified: Secondary | ICD-10-CM | POA: Diagnosis not present

## 2023-12-29 DIAGNOSIS — Z1331 Encounter for screening for depression: Secondary | ICD-10-CM | POA: Diagnosis not present

## 2024-01-19 ENCOUNTER — Emergency Department (HOSPITAL_BASED_OUTPATIENT_CLINIC_OR_DEPARTMENT_OTHER)
Admission: EM | Admit: 2024-01-19 | Discharge: 2024-01-19 | Disposition: A | Discharge status: Home / Self Care | Attending: Emergency Medicine | Admitting: Emergency Medicine

## 2024-01-19 ENCOUNTER — Other Ambulatory Visit: Payer: Self-pay

## 2024-01-19 ENCOUNTER — Emergency Department (HOSPITAL_BASED_OUTPATIENT_CLINIC_OR_DEPARTMENT_OTHER)

## 2024-01-19 ENCOUNTER — Encounter (HOSPITAL_BASED_OUTPATIENT_CLINIC_OR_DEPARTMENT_OTHER): Payer: Self-pay | Admitting: Emergency Medicine

## 2024-01-19 DIAGNOSIS — I10 Essential (primary) hypertension: Secondary | ICD-10-CM | POA: Diagnosis not present

## 2024-01-19 DIAGNOSIS — M25552 Pain in left hip: Secondary | ICD-10-CM | POA: Insufficient documentation

## 2024-01-19 DIAGNOSIS — M25512 Pain in left shoulder: Secondary | ICD-10-CM | POA: Diagnosis not present

## 2024-01-19 DIAGNOSIS — Z79899 Other long term (current) drug therapy: Secondary | ICD-10-CM | POA: Insufficient documentation

## 2024-01-19 DIAGNOSIS — W19XXXA Unspecified fall, initial encounter: Secondary | ICD-10-CM | POA: Diagnosis not present

## 2024-01-19 DIAGNOSIS — M7989 Other specified soft tissue disorders: Secondary | ICD-10-CM | POA: Diagnosis not present

## 2024-01-19 DIAGNOSIS — M47816 Spondylosis without myelopathy or radiculopathy, lumbar region: Secondary | ICD-10-CM | POA: Diagnosis not present

## 2024-01-19 MED ORDER — ACETAMINOPHEN 500 MG PO TABS
1000.0000 mg | ORAL_TABLET | Freq: Once | ORAL | Status: AC
  Administered 2024-01-19: 1000 mg via ORAL
  Filled 2024-01-19: qty 2

## 2024-01-19 NOTE — ED Provider Notes (Signed)
 [place] [facility] Provider Note   CSN: [id number] Arrival date & time: 01/19/24  1318     History  Chief Complaint  Patient presents with   [facility] Lauren Key is a 83 y.o. female history of hypertension presents following an unwitnessed mechanical fall in the grass onto her left side.  Denies hitting her head or loss consciousness.  She is not on blood thinners.  Denies any blurry vision, dizziness, difficulty ambulating or vomiting.  She primarily complains of left shoulder and left hip aching.  Does report that she had previously broken her left shoulder.  Has had no surgeries on these respective areas.   Fall   Past Medical History:  Diagnosis Date   Hypertension        Home Medications Prior to Admission medications   Medication Sig Start Date End Date Taking? Authorizing Provider  albuterol  (VENTOLIN  HFA) 108 (90 Base) MCG/ACT inhaler Inhale 1-2 puffs into the lungs every 6 (six) hours as needed for wheezing or shortness of breath.    [provider]  amLODipine  (NORVASC ) 10 MG tablet Take 10 mg by mouth daily.    [provider]  atenolol  (TENORMIN ) 50 MG tablet Take 1 tablet (50 mg total) by mouth daily. 11/28/22   Samtani, Jai-Gurmukh, MD  atorvastatin  (LIPITOR) 20 MG tablet Take 20 mg by mouth daily.    [provider]  benzonatate  (TESSALON ) 200 MG capsule Take 1 capsule (200 mg total) by mouth 3 (three) times daily as needed for cough. 11/28/22   Samtani, Jai-Gurmukh, MD  Dextromethorphan -guaiFENesin  (MUCINEX  DM MAXIMUM STRENGTH) 60-1200 MG TB12 Take 1 tablet by mouth every 12 (twelve) hours. 11/28/22   Samtani, Jai-Gurmukh, MD  menthol -cetylpyridinium (CEPACOL) 3 MG lozenge Take 1 lozenge (3 mg total) by mouth as needed for sore throat. 11/28/22   Samtani, Jai-Gurmukh, MD  primidone  (MYSOLINE ) 50 MG tablet Take 50 mg by mouth in the morning and at bedtime. 03/31/20   [provider]       Allergies    Tetanus toxoids, Tetanus-diphth-acell pertussis, and Prednisone    Review of Systems   Review of Systems  Musculoskeletal:  Positive for myalgias.    Physical Exam Updated Vital Signs BP (!) 123/103 (BP Location: Right Arm)   Pulse 68   Temp 97.6 F (36.4 C)   Resp 18   Ht 4\' 11"  (1.499 m)   SpO2 96%   BMI 28.88 kg/m  Physical Exam Vitals and nursing note reviewed.  Constitutional:      General: She is not in acute distress.    Appearance: She is well-developed.  HENT:     Head: Normocephalic and atraumatic.  Eyes:     Conjunctiva/sclera: Conjunctivae normal.  Cardiovascular:     Rate and Rhythm: Normal rate and regular rhythm.     Heart sounds: No murmur heard. Pulmonary:     Effort: Pulmonary effort is normal. No respiratory distress.     Breath sounds: Normal breath sounds.  Abdominal:     Palpations: Abdomen is soft.     Tenderness: There is no abdominal tenderness.  Musculoskeletal:        General: No swelling.     Cervical back: Neck supple.     Comments: Mild tenderness to posterior left shoulder and lateral left hip, there is no ecchymosis, swelling or gross deformities noted to the respective areas, tolerates full range of motion and has full strength of upper and lower  extremities bilaterally, radial and PT pulses 2+ and symmetric, motor function intact  Skin:    General: Skin is warm and dry.     Capillary Refill: Capillary refill takes less than 2 seconds.  Neurological:     Mental Status: She is alert.     Comments: Patient is alert and oriented. There is no abnormal phonation. Symmetric smile without facial droop. Moves all extremities spontaneously. 5/5 strength in upper and lower extremities. . No sensation deficit. There is no nystagmus. EOMI, PERRL. Coordination intact with finger to nose and normal ambulation.    Psychiatric:        Mood and Affect: Mood normal.     ED Results / Procedures / Treatments   Labs (all labs ordered  are listed, but only abnormal results are displayed) Labs Reviewed - No data to display  EKG None  Radiology DG Hip Unilat W or Wo Pelvis 2-3 Views Left Result Date: 01/19/2024 CLINICAL DATA:  Left buttock pain and swelling following a fall today. EXAM: DG HIP (WITH OR WITHOUT PELVIS) 2-3V LEFT COMPARISON:  None Available. FINDINGS: Normal-appearing left hip without fracture or dislocation. Marked lower lumbar spine degenerative changes. IMPRESSION: 1. No fracture. 2. Marked lower lumbar spine degenerative changes. Electronically Signed   By: Catherin Closs M.D.   On: 01/19/2024 14:45   DG Shoulder Left Result Date: 01/19/2024 CLINICAL DATA:  Fall and trauma to the left shoulder. EXAM: LEFT SHOULDER - 2+ VIEW COMPARISON:  Left shoulder radiograph dated 04/05/2018. FINDINGS: No acute fracture or dislocation. The bones are osteopenic. Irregularity of the humeral neck related to old healed fracture. The soft tissues are unremarkable. IMPRESSION: 1. No acute fracture or dislocation. 2. Old healed fracture of the humeral neck. Electronically Signed   By: Angus Bark M.D.   On: 01/19/2024 14:44    Procedures Procedures    Medications Ordered in ED Medications  acetaminophen  (TYLENOL ) tablet 1,000 mg (1,000 mg Oral Given 01/19/24 1504)    ED Course/ Medical Decision Making/ A&P                                 Medical Decision Making Amount and/or Complexity of Data Reviewed Radiology: ordered.   This patient presents to the ED with chief complaint(s) of mechanical fall.  The complaint involves an extensive differential diagnosis and also carries with it a high risk of complications and morbidity.   Pertinent past medical history as listed in HPI  The differential diagnosis includes  Intracranial hemorrhage, fracture, dislocation, sprain Additional history obtained: Records reviewed Care Everywhere/External Records  Assessment and management:   Patient presents hypertensive 123/103  with complaints of mechanical fall.  Denies any preceding dizziness, chest pain or shortness of breath to the fall.  She did not hit her head or lose consciousness.  Denies any headache, blurry vision, dizziness, vomiting.  She has no neurodeficits on exam.  She primarily complains of aching pain to her right shoulder and left hip.  On exam she has mild tenderness to the posterior left shoulder and lateral left hip without any swelling, ecchymosis or gross deformities noted to these areas.  She has full strength and tolerates full range of motion of upper and lower extremities bilaterally.  Pulses are 2+.  She has no cervical midline tenderness and tolerates full range of motion of cervical spine without discomfort.  She appears normocephalic atraumatic.  Do not feel that CT imaging is  necessary today.  She does have a minor abrasion to her left anterior shin without significant tenderness.  She ambulates without difficulty.  Offered x-ray to this area.  She declines.  Will obtain plain films of left shoulder and left hip.  Independent ECG interpretation:  none  Independent labs interpretation:  The following labs were independently interpreted:  none  Independent visualization and interpretation of imaging: I independently visualized the following imaging with scope of interpretation limited to determining acute life threatening conditions related to emergency care:  Left shoulder and left hip x-rays no acute abnormality, old well-healed proximal humerus fracture   Consultations obtained:   none  Disposition:   Patient will be discharged home. The patient has been appropriately medically screened and/or stabilized in the ED. I have low suspicion for any other emergent medical condition which would require further screening, evaluation or treatment in the ED or require inpatient management. At time of discharge the patient is hemodynamically stable and in no acute distress. I have discussed work-up  results and diagnosis with patient and answered all questions. Patient is agreeable with discharge plan. We discussed strict return precautions for returning to the emergency department and they verbalized understanding.     Social Determinants of Health:   none  This note was dictated with voice recognition software.  Despite best efforts at proofreading, errors may have occurred which can change the documentation meaning.          Final Clinical Impression(s) / ED Diagnoses Final diagnoses:  Fall, initial encounter    Rx / DC Orders ED Discharge Orders     None         Felicie Horning, PA-C 01/19/24 1521    Albertus Hughs, DO 01/21/24 [phone]

## 2024-01-19 NOTE — Discharge Instructions (Addendum)
 You were evaluated in the emergency room for fall.  Your imaging did not show any fracture.  You may use Tylenol , ibuprofen , heat or ice for pain.  If your symptoms persist.  Please follow-up with your primary care doctor or call the number in his chart for an orthopedic doctor.

## 2024-01-19 NOTE — ED Triage Notes (Signed)
 Pt POV shuffled gait- pt reports falling today appx 30 min PTA, landed on L shoulder. C/o L shoulder pain. Denies head injury.   Pt with ongoing upper body tremor, she reports this is normal.  AOx4, answers questions appropriately.

## 2024-04-07 DIAGNOSIS — Z1283 Encounter for screening for malignant neoplasm of skin: Secondary | ICD-10-CM | POA: Diagnosis not present

## 2024-04-07 DIAGNOSIS — D225 Melanocytic nevi of trunk: Secondary | ICD-10-CM | POA: Diagnosis not present

## 2024-04-07 DIAGNOSIS — B078 Other viral warts: Secondary | ICD-10-CM | POA: Diagnosis not present

## 2024-05-28 ENCOUNTER — Encounter: Payer: Self-pay | Admitting: Cardiology

## 2024-05-28 ENCOUNTER — Ambulatory Visit: Attending: Cardiology | Admitting: Cardiology

## 2024-05-28 VITALS — BP 182/66 | HR 69 | Ht 59.0 in | Wt 130.0 lb

## 2024-05-28 DIAGNOSIS — E782 Mixed hyperlipidemia: Secondary | ICD-10-CM | POA: Insufficient documentation

## 2024-05-28 DIAGNOSIS — R002 Palpitations: Secondary | ICD-10-CM | POA: Insufficient documentation

## 2024-05-28 DIAGNOSIS — R011 Cardiac murmur, unspecified: Secondary | ICD-10-CM | POA: Diagnosis not present

## 2024-05-28 DIAGNOSIS — I10 Essential (primary) hypertension: Secondary | ICD-10-CM | POA: Diagnosis not present

## 2024-05-28 DIAGNOSIS — I491 Atrial premature depolarization: Secondary | ICD-10-CM | POA: Insufficient documentation

## 2024-05-28 NOTE — Patient Instructions (Signed)
 Medication Instructions:  The current medical regimen is effective;  continue present plan and medications.  *If you need a refill on your cardiac medications before your next appointment, please call your pharmacy*  Follow-Up: At [facility], you and your health needs are our priority.  As part of our continuing mission to provide you with exceptional heart care, our providers are all part of one team.  This team includes your primary Cardiologist (physician) and Advanced Practice Providers or APPs (Physician Assistants and Nurse Practitioners) who all work together to provide you with the care you need, when you need it.  Follow up as needed.   We recommend signing up for the patient portal called "MyChart".  Sign up information is provided on this After Visit Summary.  MyChart is used to connect with patients for Virtual Visits (Telemedicine).  Patients are able to view lab/test results, encounter notes, upcoming appointments, etc.  Non-urgent messages can be sent to your provider as well.   To learn more about what you can do with MyChart, go to [email or web address].

## 2024-05-28 NOTE — Progress Notes (Signed)
 Cardiology Office Note:  .   Date:  05/28/2024  ID:  Lauren Key, DOB Mar 21, 1941, MRN [id number] PCP: Seabron Lenis, MD  [facility] Providers Cardiologist:  Victory LELON Claudene DOUGLAS, MD (Inactive)     History of Present Illness: .   Lauren Key is a 83 y.o. female Discussed the use of AI scribe software for clinical note transcription with the patient, who gave verbal consent to proceed.  History of Present Illness Lauren Key is an 83 year old female who presents with palpitations.  She experiences occasional palpitations, described as skipped beats. A cardiac monitor in 2021 showed normal sinus rhythm, brief episodes of supraventricular tachycardia (SVT), atrial tachycardia at a rate of 130, and isolated premature atrial contractions (PACs). An echocardiogram from 2021 was performed; the patient recalls being told her heart function was normal. No chest pain, fainting episodes, shortness of breath, or sudden breathing difficulties.  Her current medications include atorvastatin  20 mg daily for cholesterol management, amlodipine  10 mg daily, atenolol  50 mg daily, and chlorthalidone 25 mg for blood pressure control. She takes atenolol  at night and has previously taken it during the day. She occasionally uses Aleve for hip pain due to arthritis but avoids daily use due to concerns about medication intake.  In terms of family history, her father had Alzheimer's disease and passed away from pneumonia, while her mother lived to nearly 81 years old and passed away from COVID-19. Her grandparents had a history of heart attacks and strokes, indicating a familial predisposition to cardiovascular issues.  She denies any history of diabetes, smoking, or significant alcohol use. She experienced a fall in May while outside, resulting in shoulder pain, but X-rays confirmed no fracture.     Studies Reviewed: SABRA   EKG Interpretation Date/Time:  Friday May 28 2024 08:16:08  EDT Ventricular Rate:  69 PR Interval:  150 QRS Duration:  94 QT Interval:  422 QTC Calculation: 452 R Axis:   -33  Text Interpretation: Normal sinus rhythm Left axis deviation Left ventricular hypertrophy ( R in aVL , Cornell product , Romhilt-Estes ) When compared with ECG of 25-Nov-2022 15:46, No significant change since last tracing Confirmed by Jeffrie Anes ([id number]) on 05/28/2024 8:33:47 AM    Results LABS LDL: 83 HbA1c: 6.5 Hb: 14.7 Cr: 0.8 TSH: 3.8  RADIOLOGY Shoulder X-ray: No fracture (01/2024)  DIAGNOSTIC Echocardiogram: EF 65%, Grade 1 diastolic dysfunction, trivial mitral regurgitation (2021) Cardiac monitor: Normal sinus rhythm, SVT, brief atrial tachycardia 130, isolated PACs (2021) EKG: Normal Risk Assessment/Calculations:           Physical Exam:   VS:  BP (!) 182/66   Pulse 69   Ht 4' 11 (1.499 m)   Wt 130 lb (59 kg)   SpO2 98%   BMI 26.26 kg/m    Wt Readings from Last 3 Encounters:  05/28/24 130 lb (59 kg)  11/25/22 143 lb (64.9 kg)  12/01/20 153 lb (69.4 kg)    GEN: Well nourished, well developed in no acute distress NECK: No JVD; No carotid bruits CARDIAC: RRR, soft systolic murmur, no rubs, no gallops RESPIRATORY:  Clear to auscultation without rales, wheezing or rhonchi  ABDOMEN: Soft, non-tender, non-distended EXTREMITIES:  No edema; No deformity   ASSESSMENT AND PLAN: .    Assessment and Plan Assessment & Plan Premature atrial contractions (PACs) Intermittent skipped beats identified as PACs, benign with no associated symptoms such as fainting, shortness of breath, or chest pain. Current management with atenolol   is appropriate and helps with palpitations. - Continue atenolol  50 mg daily, can be taken at night or during the day based on preference and symptom control.  Essential hypertension Blood pressure management is ongoing with amlodipine , atenolol , and chlorthalidone. Office blood pressure reading was elevated; home monitoring is  recommended to assess true levels. Discussed potential impact of non-prescription medications like Aleve on blood pressure and emphasized avoiding low blood pressure to prevent falls. - Continue current antihypertensive regimen: amlodipine , atenolol , and chlorthalidone. - Encourage home blood pressure monitoring to obtain accurate readings. - Advise against daily use of Aleve due to potential increase in blood pressure.  Hyperlipidemia LDL cholesterol level is well-controlled at 83 mg/dL with atorvastatin  therapy, effectively stabilizing potential plaque formation. - Continue atorvastatin  20 mg daily.         Dispo: Graduate from cardiology clinic.SABRA  Please let us  know if she need further assistance.  Signed, Oneil Parchment, MD

## 2024-06-29 DIAGNOSIS — K5900 Constipation, unspecified: Secondary | ICD-10-CM | POA: Diagnosis not present

## 2024-06-29 DIAGNOSIS — M85852 Other specified disorders of bone density and structure, left thigh: Secondary | ICD-10-CM | POA: Diagnosis not present

## 2024-06-29 DIAGNOSIS — R251 Tremor, unspecified: Secondary | ICD-10-CM | POA: Diagnosis not present

## 2024-06-29 DIAGNOSIS — I10 Essential (primary) hypertension: Secondary | ICD-10-CM | POA: Diagnosis not present

## 2024-06-29 DIAGNOSIS — Z8719 Personal history of other diseases of the digestive system: Secondary | ICD-10-CM | POA: Diagnosis not present

## 2024-06-29 DIAGNOSIS — E559 Vitamin D deficiency, unspecified: Secondary | ICD-10-CM | POA: Diagnosis not present

## 2024-06-29 DIAGNOSIS — M109 Gout, unspecified: Secondary | ICD-10-CM | POA: Diagnosis not present

## 2024-06-29 DIAGNOSIS — E1169 Type 2 diabetes mellitus with other specified complication: Secondary | ICD-10-CM | POA: Diagnosis not present

## 2024-06-29 DIAGNOSIS — B029 Zoster without complications: Secondary | ICD-10-CM | POA: Diagnosis not present

## 2024-06-29 DIAGNOSIS — K760 Fatty (change of) liver, not elsewhere classified: Secondary | ICD-10-CM | POA: Diagnosis not present

## 2024-06-29 DIAGNOSIS — J309 Allergic rhinitis, unspecified: Secondary | ICD-10-CM | POA: Diagnosis not present

## 2024-06-29 DIAGNOSIS — E782 Mixed hyperlipidemia: Secondary | ICD-10-CM | POA: Diagnosis not present

## 2024-09-28 ENCOUNTER — Other Ambulatory Visit (HOSPITAL_BASED_OUTPATIENT_CLINIC_OR_DEPARTMENT_OTHER): Payer: Self-pay | Admitting: Family Medicine

## 2024-09-28 DIAGNOSIS — M85852 Other specified disorders of bone density and structure, left thigh: Secondary | ICD-10-CM

## 2024-10-19 ENCOUNTER — Ambulatory Visit (HOSPITAL_BASED_OUTPATIENT_CLINIC_OR_DEPARTMENT_OTHER)
Admission: RE | Admit: 2024-10-19 | Discharge: 2024-10-19 | Disposition: A | Source: Ambulatory Visit | Attending: Family Medicine | Admitting: Family Medicine

## 2024-10-19 DIAGNOSIS — M85852 Other specified disorders of bone density and structure, left thigh: Secondary | ICD-10-CM
